# Patient Record
Sex: Female | Born: 1965
Health system: Southern US, Community
[De-identification: ages and names within clinical notes are randomized; demographics above are authoritative.]

## PROBLEM LIST (undated history)

## (undated) DIAGNOSIS — J45909 Unspecified asthma, uncomplicated: Secondary | ICD-10-CM

## (undated) HISTORY — DX: Unspecified asthma, uncomplicated: J45.909

## (undated) HISTORY — PX: KNEE ARTHROSCOPY: SUR90

---

## 1998-04-12 ENCOUNTER — Other Ambulatory Visit: Admission: RE | Admit: 1998-04-12 | Discharge: 1998-04-12 | Payer: Self-pay | Admitting: Obstetrics and Gynecology

## 1999-10-10 ENCOUNTER — Inpatient Hospital Stay (HOSPITAL_COMMUNITY): Admission: AD | Admit: 1999-10-10 | Discharge: 1999-10-10 | Payer: Self-pay | Admitting: Obstetrics & Gynecology

## 1999-10-29 ENCOUNTER — Encounter (INDEPENDENT_AMBULATORY_CARE_PROVIDER_SITE_OTHER): Payer: Self-pay | Admitting: Specialist

## 1999-10-29 ENCOUNTER — Inpatient Hospital Stay (HOSPITAL_COMMUNITY): Admission: AD | Admit: 1999-10-29 | Discharge: 1999-11-02 | Payer: Self-pay | Admitting: *Deleted

## 1999-12-11 ENCOUNTER — Encounter: Admission: RE | Admit: 1999-12-11 | Discharge: 2000-02-27 | Payer: Self-pay | Admitting: Obstetrics & Gynecology

## 2004-09-14 ENCOUNTER — Other Ambulatory Visit: Admission: RE | Admit: 2004-09-14 | Discharge: 2004-09-14 | Payer: Self-pay | Admitting: Obstetrics and Gynecology

## 2005-09-25 ENCOUNTER — Other Ambulatory Visit: Admission: RE | Admit: 2005-09-25 | Discharge: 2005-09-25 | Payer: Self-pay | Admitting: Obstetrics and Gynecology

## 2007-01-22 ENCOUNTER — Other Ambulatory Visit: Admission: RE | Admit: 2007-01-22 | Discharge: 2007-01-22 | Payer: Self-pay | Admitting: Obstetrics and Gynecology

## 2008-12-09 ENCOUNTER — Other Ambulatory Visit: Admission: RE | Admit: 2008-12-09 | Discharge: 2008-12-09 | Payer: Self-pay | Admitting: Obstetrics and Gynecology

## 2010-09-26 ENCOUNTER — Other Ambulatory Visit (HOSPITAL_COMMUNITY)
Admission: RE | Admit: 2010-09-26 | Discharge: 2010-09-26 | Disposition: A | Discharge status: Home / Self Care | Payer: BC Managed Care – PPO | Source: Ambulatory Visit | Attending: Obstetrics and Gynecology | Admitting: Obstetrics and Gynecology

## 2010-09-26 ENCOUNTER — Other Ambulatory Visit: Payer: Self-pay | Admitting: Nurse Practitioner

## 2010-09-26 DIAGNOSIS — Z1159 Encounter for screening for other viral diseases: Secondary | ICD-10-CM | POA: Insufficient documentation

## 2010-09-26 DIAGNOSIS — Z01419 Encounter for gynecological examination (general) (routine) without abnormal findings: Secondary | ICD-10-CM | POA: Insufficient documentation

## 2010-10-17 ENCOUNTER — Other Ambulatory Visit: Payer: Self-pay | Admitting: Nurse Practitioner

## 2010-10-18 ENCOUNTER — Other Ambulatory Visit: Payer: Self-pay | Admitting: Obstetrics and Gynecology

## 2010-12-15 NOTE — Op Note (Signed)
[facility]  Patient:    Dana Savage, Dana Savage                          MRN: [id number] Proc. Date: 10/30/99 Adm. Date:  [id number] Disc. Date: [id number] Attending:  Donne Hazel                           Operative Report  PROCEDURE:                    Primary low transverse cervical cesarean section ith delivery of viable female infant, Apgars of 9 at one minute and 9 at five minutes.  SECONDARY PROCEDURE:          Myomectomy of an approximately 3 cm myoma on the anterior fundal surface.  SURGEON:                      Freddy Finner, M.D.  ANESTHESIA:                   Epidural.  ESTIMATED BLOOD LOSS:         600 cc.  COMPLICATIONS:                None.  PREOPERATIVE DIAGNOSIS:       Failure to progress due to cephalopelvic disproportion.  PROCEDURE:                    Patient is a 45 year old who had a protracted active phase of labor by 6:30 a.m. on the second.  She was 8 cm, completely effaced with a vertex moving to a -1 station.  At approximately 9:30 which was three hours later there was essentially no change in her cervix and the head was molding, but possibly in the occiput posterior.  It was not felt that the patient could delivery under any circumstances vaginally.  It was elected to proceed with cesarean.                                Her epidural was in place and it was dosed for surgery.  She was brought to the operating room and placed in the dorsal recumbent position.  Lower abdomen was prepped and draped in the usual fashion.  Foley catheter was indwelling.  A lower abdominal transverse incision was made and carried sharply down to the fascia which was entered sharply and extended to the extent of the skin incision.  Rectus ______ was developed superiorly and inferiorly with blunt and sharp dissection.  Rectus muscles divided in the midline. Peritoneum was entered sharply and extended bluntly to the extent of the  skin incision.  Bladder blade was placed.  Transverse incision was made in the visceral peritoneum overlying the lower uterine segment with the bladder blunt dissected off the lower segment.  Transverse incision was made in the lower segment and extended bluntly in a transverse direction.  A viable female infant was then delivered with Apgars of 9 and 9.  Birth weight was 8 pounds 1 ounce.  Placenta and other products of conception removed from the uterus.  Uterus was inspected and found to have  large sessile fibroid on the fundus anteriorly and this was sharply excised and the defect was very superficial.  It was closed with a couple of figure-of-eight sutures of  0 Monocryl.  The uterine incision itself was closed with running locking 0 Monocryl for uterine closure.  Hemostasis was adequate.  Irrigation was carried out.  Bladder flap was reapproximated with running 0 Monocryl.  All packs, needles, and instruments were removed.  Hemostasis was complete.  Tubes and ovaries were  inspected and found to be normal.  The abdominal incision was then closed in layers running 0 Monocryl.  It was used to close the peritoneum and reapproximate the rectus muscles.  Fascia was closed with running 0 PDS.  Skin was closed with wide skin staples and 1/4 inch Steri-Strips.  Patient tolerated the procedure well and was taken to recovery in good condition. DD:  11/17/99 TD:  11/17/99 Job: [id number]

## 2010-12-15 NOTE — Discharge Summary (Signed)
[facility]  Patient:    Dana Savage, Dana Savage                          MRN: [id number] Adm. Date:  [id number] Disc. Date: [id number] Attending:  Donne Hazel Dictator:   Danie Chandler, R.N.                           Discharge Summary  ADMISSION DIAGNOSIS:          Intrauterine pregnancy at term with spontaneous rupture of membranes in early labor.  DISCHARGE DIAGNOSES:          1. Intrauterine pregnancy at term with                                  spontaneous rupture of membranes in early                                  labor.                               2. Failure to progress due to cephalopelvic                                  disproportion.  PROCEDURES:                   On October 30, 1999, primary low transverse cervical cesarean section and a secondary procedure of a myomectomy of approximately 3 cm myoma on the anterior fundal surface.  HISTORY OF PRESENT ILLNESS:   The patient is a 45 year old, gravida 1, para 0, at 37-1/[redacted] weeks gestation, who was admitted with spontaneous rupture of membranes in early labor.  The patient had a protracted active phase of labor by 6:30 a.m. on October 30, 1999.  She was 8 cm, completely effaced with a vertex moving to a -1 station.  At approximately 9:30 a.m., which was approximately three hours later, there was essentially no change in her cervix and the head was molding, but possibly in the occipitoposterior position.  It was not felt that the patient could deliver under any circumstances vaginally. It was elected to proceed with primary cesarean delivery.  HOSPITAL COURSE:              The patient was taken to the operating room and underwent the above-named procedure without complication.  This was productive of a viable female infant with Apgars of 9 and one minute and 9 at five minutes. Postoperatively on postoperative day #1, the patients hemoglobin was 9.6, hematocrit 29.0, and white blood cell count  13.6.  On postoperative day #2, the patient had a good return of bowel function and was tolerating a regular diet.  She was also ambulating well without difficulty and had good pain control.  Her hemoglobin was stable on this day at 9.3.  DISPOSITION:                  She was discharged home on postoperative day #3.  CONDITION ON DISCHARGE:       Good.  DIET:  Regular as tolerated.  ACTIVITY:                     No heavy lifting, no driving, and no vaginal entry.  FOLLOW-UP:                    She is to follow up in the office in one to two weeks for incision check.  She is to call for temperature greater than 100 degrees, persistent nausea, vomiting, heavy vaginal bleeding, and/or redness or drainage from the incision site.  DISCHARGE MEDICATIONS: 1. Prenatal vitamins one p.o. q.d. 2. Percocet as directed by M.D. DD:  12/20/99 TD:  12/23/99 Job: [id number]

## 2011-05-22 ENCOUNTER — Encounter (HOSPITAL_COMMUNITY): Payer: Self-pay

## 2011-05-22 ENCOUNTER — Encounter (HOSPITAL_COMMUNITY)
Admission: RE | Admit: 2011-05-22 | Discharge: 2011-05-22 | Disposition: A | Discharge status: Home / Self Care | Payer: BC Managed Care – PPO | Source: Ambulatory Visit | Attending: Obstetrics and Gynecology | Admitting: Obstetrics and Gynecology

## 2011-05-22 LAB — CBC
HCT: 35.4 % — ABNORMAL LOW (ref 36.0–46.0)
Hemoglobin: 11.3 g/dL — ABNORMAL LOW (ref 12.0–15.0)
MCH: 27.2 pg (ref 26.0–34.0)
MCHC: 31.9 g/dL (ref 30.0–36.0)
RDW: 13.2 % (ref 11.5–15.5)

## 2011-05-22 LAB — SURGICAL PCR SCREEN
MRSA, PCR: NEGATIVE
Staphylococcus aureus: NEGATIVE

## 2011-05-22 NOTE — Patient Instructions (Signed)
   Your procedure is scheduled on: MOnday 05/28/11  Enter through the Main Entrance of [facility] at:6am Pick up the phone at the desk and dial [phone] and inform us of your arrival.  Please call this number if you have any problems the morning of surgery: [phone]  Remember: Do not eat food after midnight:Sunday Do not drink clear liquids after :midnight Sunday Take these medicines the morning of surgery with a SIP OF WATER: none  Do not wear jewelry, make-up, or FINGER nail polish Do not wear lotions, powders, or perfumes.  You may wear deodorant. Do not shave 48 hours prior to surgery. Do not bring valuables to the hospital.  Leave suitcase in the car. After Surgery it may be brought to your room. For patients being admitted to the hospital, checkout time is 11:00am the day of discharge.  Patients discharged on the day of surgery will not be allowed to drive home.   Name and phone number of your driver: Lacy Duverney- cell- [phone]  Remember to use your hibiclens as instructed.Please shower with 1/2 bottle the evening before your surgery and the other 1/2 bottle the morning of surgery.

## 2011-05-23 ENCOUNTER — Other Ambulatory Visit: Payer: Self-pay | Admitting: Obstetrics and Gynecology

## 2011-05-23 ENCOUNTER — Other Ambulatory Visit (HOSPITAL_COMMUNITY)
Admission: RE | Admit: 2011-05-23 | Discharge: 2011-05-23 | Disposition: A | Discharge status: Home / Self Care | Payer: BC Managed Care – PPO | Source: Ambulatory Visit | Attending: Obstetrics and Gynecology | Admitting: Obstetrics and Gynecology

## 2011-05-23 DIAGNOSIS — Z01419 Encounter for gynecological examination (general) (routine) without abnormal findings: Secondary | ICD-10-CM | POA: Insufficient documentation

## 2011-05-25 ENCOUNTER — Other Ambulatory Visit: Payer: Self-pay | Admitting: Obstetrics and Gynecology

## 2011-05-27 MED ORDER — DEXTROSE 5 % IV SOLN
1.0000 g | INTRAVENOUS | Status: AC
  Administered 2011-05-28: 1 g via INTRAVENOUS
  Filled 2011-05-27: qty 1

## 2011-05-28 ENCOUNTER — Encounter (HOSPITAL_COMMUNITY): Payer: Self-pay | Admitting: *Deleted

## 2011-05-28 ENCOUNTER — Inpatient Hospital Stay (HOSPITAL_COMMUNITY): Payer: BC Managed Care – PPO | Admitting: Anesthesiology

## 2011-05-28 ENCOUNTER — Encounter (HOSPITAL_COMMUNITY): Payer: Self-pay | Admitting: Anesthesiology

## 2011-05-28 ENCOUNTER — Other Ambulatory Visit: Payer: Self-pay | Admitting: Obstetrics and Gynecology

## 2011-05-28 ENCOUNTER — Inpatient Hospital Stay (HOSPITAL_COMMUNITY)
Admission: RE | Admit: 2011-05-28 | Discharge: 2011-05-30 | DRG: 359 | Disposition: A | Discharge status: Home / Self Care | Payer: BC Managed Care – PPO | Source: Ambulatory Visit | Attending: Obstetrics and Gynecology | Admitting: Obstetrics and Gynecology

## 2011-05-28 ENCOUNTER — Encounter (HOSPITAL_COMMUNITY)
Admission: RE | Disposition: A | Discharge status: Home / Self Care | Payer: Self-pay | Source: Ambulatory Visit | Attending: Obstetrics and Gynecology

## 2011-05-28 DIAGNOSIS — N8000 Endometriosis of the uterus, unspecified: Secondary | ICD-10-CM | POA: Diagnosis present

## 2011-05-28 DIAGNOSIS — D250 Submucous leiomyoma of uterus: Secondary | ICD-10-CM | POA: Diagnosis present

## 2011-05-28 DIAGNOSIS — N83209 Unspecified ovarian cyst, unspecified side: Secondary | ICD-10-CM | POA: Diagnosis present

## 2011-05-28 DIAGNOSIS — D252 Subserosal leiomyoma of uterus: Secondary | ICD-10-CM | POA: Diagnosis present

## 2011-05-28 DIAGNOSIS — Z01812 Encounter for preprocedural laboratory examination: Secondary | ICD-10-CM

## 2011-05-28 DIAGNOSIS — N946 Dysmenorrhea, unspecified: Secondary | ICD-10-CM | POA: Diagnosis present

## 2011-05-28 DIAGNOSIS — D251 Intramural leiomyoma of uterus: Secondary | ICD-10-CM | POA: Diagnosis present

## 2011-05-28 DIAGNOSIS — Z01818 Encounter for other preprocedural examination: Secondary | ICD-10-CM

## 2011-05-28 DIAGNOSIS — N920 Excessive and frequent menstruation with regular cycle: Principal | ICD-10-CM | POA: Diagnosis present

## 2011-05-28 DIAGNOSIS — Z90710 Acquired absence of both cervix and uterus: Secondary | ICD-10-CM

## 2011-05-28 HISTORY — PX: ABDOMINAL HYSTERECTOMY: SHX81

## 2011-05-28 HISTORY — PX: SALPINGOOPHORECTOMY: SHX82

## 2011-05-28 LAB — TYPE AND SCREEN
ABO/RH(D): A POS
Antibody Screen: NEGATIVE

## 2011-05-28 SURGERY — HYSTERECTOMY, ABDOMINAL
Anesthesia: Choice | Site: Abdomen | Wound class: Clean Contaminated

## 2011-05-28 MED ORDER — MIDAZOLAM HCL 5 MG/5ML IJ SOLN
INTRAMUSCULAR | Status: DC | PRN
  Administered 2011-05-28: 1 mg via INTRAVENOUS

## 2011-05-28 MED ORDER — DIPHENHYDRAMINE HCL 12.5 MG/5ML PO ELIX: 12.5000 mg | ORAL_SOLUTION | Freq: Four times a day (QID) | ORAL | Status: DC | PRN

## 2011-05-28 MED ORDER — SENNOSIDES-DOCUSATE SODIUM 8.6-50 MG PO TABS
2.0000 | ORAL_TABLET | Freq: Every day | ORAL | Status: DC | PRN
  Administered 2011-05-29: 2 via ORAL

## 2011-05-28 MED ORDER — FENTANYL CITRATE 0.05 MG/ML IJ SOLN
INTRAMUSCULAR | Status: AC
  Administered 2011-05-28: 50 ug via INTRAVENOUS
  Filled 2011-05-28: qty 2

## 2011-05-28 MED ORDER — DEXAMETHASONE SODIUM PHOSPHATE 10 MG/ML IJ SOLN
INTRAMUSCULAR | Status: AC
  Filled 2011-05-28: qty 1

## 2011-05-28 MED ORDER — NEOSTIGMINE METHYLSULFATE 1 MG/ML IJ SOLN
INTRAMUSCULAR | Status: DC | PRN
  Administered 2011-05-28: 4 mg via INTRAVENOUS

## 2011-05-28 MED ORDER — GLYCOPYRROLATE 0.2 MG/ML IJ SOLN
INTRAMUSCULAR | Status: DC | PRN
  Administered 2011-05-28: .8 mg via INTRAVENOUS

## 2011-05-28 MED ORDER — ROCURONIUM BROMIDE 50 MG/5ML IV SOLN
INTRAVENOUS | Status: AC
  Filled 2011-05-28: qty 1

## 2011-05-28 MED ORDER — NEOSTIGMINE METHYLSULFATE 1 MG/ML IJ SOLN
INTRAMUSCULAR | Status: AC
  Filled 2011-05-28: qty 10

## 2011-05-28 MED ORDER — FENTANYL CITRATE 0.05 MG/ML IJ SOLN
25.0000 ug | INTRAMUSCULAR | Status: DC | PRN
  Administered 2011-05-28 (×2): 50 ug via INTRAVENOUS

## 2011-05-28 MED ORDER — DEXAMETHASONE SODIUM PHOSPHATE 10 MG/ML IJ SOLN
INTRAMUSCULAR | Status: DC | PRN
  Administered 2011-05-28: 10 mg via INTRAVENOUS

## 2011-05-28 MED ORDER — GLYCOPYRROLATE 0.2 MG/ML IJ SOLN
INTRAMUSCULAR | Status: AC
  Filled 2011-05-28: qty 2

## 2011-05-28 MED ORDER — PROPOFOL 10 MG/ML IV EMUL
INTRAVENOUS | Status: DC | PRN
  Administered 2011-05-28: 150 mg via INTRAVENOUS

## 2011-05-28 MED ORDER — FENTANYL CITRATE 0.05 MG/ML IJ SOLN
INTRAMUSCULAR | Status: DC | PRN
  Administered 2011-05-28: 100 ug via INTRAVENOUS
  Administered 2011-05-28 (×4): 50 ug via INTRAVENOUS

## 2011-05-28 MED ORDER — ONDANSETRON HCL 4 MG/2ML IJ SOLN
INTRAMUSCULAR | Status: DC | PRN
  Administered 2011-05-28: 4 mg via INTRAVENOUS

## 2011-05-28 MED ORDER — PROMETHAZINE HCL 25 MG/ML IJ SOLN
12.5000 mg | INTRAMUSCULAR | Status: DC | PRN
  Filled 2011-05-28: qty 1

## 2011-05-28 MED ORDER — EPHEDRINE 5 MG/ML INJ
INTRAVENOUS | Status: AC
  Filled 2011-05-28: qty 10

## 2011-05-28 MED ORDER — ONDANSETRON HCL 4 MG/2ML IJ SOLN
4.0000 mg | Freq: Four times a day (QID) | INTRAMUSCULAR | Status: DC | PRN
  Administered 2011-05-28: 4 mg via INTRAVENOUS
  Filled 2011-05-28 (×2): qty 2

## 2011-05-28 MED ORDER — FENTANYL CITRATE 0.05 MG/ML IJ SOLN
INTRAMUSCULAR | Status: AC
  Filled 2011-05-28: qty 2

## 2011-05-28 MED ORDER — HYDROMORPHONE 0.3 MG/ML IV SOLN
INTRAVENOUS | Status: AC
  Filled 2011-05-28: qty 25

## 2011-05-28 MED ORDER — HYDROMORPHONE 0.3 MG/ML IV SOLN
INTRAVENOUS | Status: DC
  Administered 2011-05-28: 6 mL via INTRAVENOUS
  Administered 2011-05-28: 1.2 mg via INTRAVENOUS
  Administered 2011-05-28: 12:00:00 via INTRAVENOUS
  Administered 2011-05-28: 1 mL via INTRAVENOUS
  Administered 2011-05-29: 0.3 mg via INTRAVENOUS
  Administered 2011-05-29: 0.6 mg via INTRAVENOUS

## 2011-05-28 MED ORDER — ONDANSETRON HCL 4 MG/2ML IJ SOLN
INTRAMUSCULAR | Status: AC
  Filled 2011-05-28: qty 2

## 2011-05-28 MED ORDER — ALBUTEROL SULFATE HFA 108 (90 BASE) MCG/ACT IN AERS
INHALATION_SPRAY | RESPIRATORY_TRACT | Status: AC
  Administered 2011-05-28: 2 via RESPIRATORY_TRACT
  Filled 2011-05-28: qty 6.7

## 2011-05-28 MED ORDER — MIDAZOLAM HCL 2 MG/2ML IJ SOLN
INTRAMUSCULAR | Status: AC
  Filled 2011-05-28: qty 2

## 2011-05-28 MED ORDER — KETOROLAC TROMETHAMINE 30 MG/ML IJ SOLN
30.0000 mg | Freq: Three times a day (TID) | INTRAMUSCULAR | Status: DC
  Administered 2011-05-28 – 2011-05-29 (×3): 30 mg via INTRAVENOUS
  Filled 2011-05-28 (×3): qty 1

## 2011-05-28 MED ORDER — KETOROLAC TROMETHAMINE 30 MG/ML IJ SOLN
INTRAMUSCULAR | Status: DC | PRN
  Administered 2011-05-28: 30 mg via INTRAVENOUS

## 2011-05-28 MED ORDER — DIPHENHYDRAMINE HCL 50 MG/ML IJ SOLN: 12.5000 mg | Freq: Four times a day (QID) | INTRAMUSCULAR | Status: DC | PRN

## 2011-05-28 MED ORDER — FENTANYL CITRATE 0.05 MG/ML IJ SOLN
INTRAMUSCULAR | Status: AC
  Filled 2011-05-28: qty 5

## 2011-05-28 MED ORDER — LACTATED RINGERS IV SOLN
INTRAVENOUS | Status: DC
  Administered 2011-05-28 (×4): via INTRAVENOUS

## 2011-05-28 MED ORDER — LIDOCAINE HCL (CARDIAC) 20 MG/ML IV SOLN
INTRAVENOUS | Status: DC | PRN
  Administered 2011-05-28: 80 mg via INTRAVENOUS

## 2011-05-28 MED ORDER — LACTATED RINGERS IV SOLN
INTRAVENOUS | Status: DC
  Administered 2011-05-28 – 2011-05-29 (×2): via INTRAVENOUS

## 2011-05-28 MED ORDER — EPHEDRINE SULFATE 50 MG/ML IJ SOLN
INTRAMUSCULAR | Status: DC | PRN
  Administered 2011-05-28: 5 mg via INTRAVENOUS

## 2011-05-28 MED ORDER — SUCCINYLCHOLINE CHLORIDE 20 MG/ML IJ SOLN
INTRAMUSCULAR | Status: AC
  Filled 2011-05-28: qty 1

## 2011-05-28 MED ORDER — PROPOFOL 10 MG/ML IV EMUL
INTRAVENOUS | Status: AC
  Filled 2011-05-28: qty 20

## 2011-05-28 MED ORDER — NALOXONE HCL 0.4 MG/ML IJ SOLN: 0.4000 mg | INTRAMUSCULAR | Status: DC | PRN

## 2011-05-28 MED ORDER — SUCCINYLCHOLINE CHLORIDE 20 MG/ML IJ SOLN
INTRAMUSCULAR | Status: DC | PRN
  Administered 2011-05-28: 80 mg via INTRAVENOUS

## 2011-05-28 MED ORDER — ROCURONIUM BROMIDE 100 MG/10ML IV SOLN
INTRAVENOUS | Status: DC | PRN
  Administered 2011-05-28: 5 mg via INTRAVENOUS
  Administered 2011-05-28: 25 mg via INTRAVENOUS
  Administered 2011-05-28: 10 mg via INTRAVENOUS

## 2011-05-28 MED ORDER — IBUPROFEN 600 MG PO TABS
600.0000 mg | ORAL_TABLET | Freq: Four times a day (QID) | ORAL | Status: DC | PRN
  Administered 2011-05-29 – 2011-05-30 (×3): 600 mg via ORAL
  Filled 2011-05-28 (×3): qty 1

## 2011-05-28 MED ORDER — MICROFIBRILLAR COLL HEMOSTAT EX POWD
CUTANEOUS | Status: DC | PRN
  Administered 2011-05-28: 1 g via TOPICAL

## 2011-05-28 MED ORDER — SODIUM CHLORIDE 0.9 % IJ SOLN: 9.0000 mL | INTRAMUSCULAR | Status: DC | PRN

## 2011-05-28 MED ORDER — LIDOCAINE HCL (CARDIAC) 20 MG/ML IV SOLN
INTRAVENOUS | Status: AC
  Filled 2011-05-28: qty 5

## 2011-05-28 SURGICAL SUPPLY — 44 items
APL SKNCLS STERI-STRIP NONHPOA (GAUZE/BANDAGES/DRESSINGS) ×2
APPLICATOR COTTON TIP 6IN STRL (MISCELLANEOUS) ×3 IMPLANT
BENZOIN TINCTURE PRP APPL 2/3 (GAUZE/BANDAGES/DRESSINGS) ×1 IMPLANT
CANISTER SUCTION 2500CC (MISCELLANEOUS) ×3 IMPLANT
CLOTH BEACON ORANGE TIMEOUT ST (SAFETY) ×3 IMPLANT
CONT PATH 16OZ SNAP LID 3702 (MISCELLANEOUS) ×3 IMPLANT
DECANTER SPIKE VIAL GLASS SM (MISCELLANEOUS) IMPLANT
DRAPE UTILITY XL STRL (DRAPES) ×3 IMPLANT
DRESSING TELFA 8X3 (GAUZE/BANDAGES/DRESSINGS) ×1 IMPLANT
DRSG PAD ABDOMINAL 8X10 ST (GAUZE/BANDAGES/DRESSINGS) ×1 IMPLANT
GAUZE SPONGE 4X4 12PLY STRL LF (GAUZE/BANDAGES/DRESSINGS) ×1 IMPLANT
GAUZE SPONGE 4X4 16PLY XRAY LF (GAUZE/BANDAGES/DRESSINGS) ×3 IMPLANT
GLOVE BIOGEL M 6.5 STRL (GLOVE) ×2 IMPLANT
GLOVE BIOGEL PI IND STRL 6.5 (GLOVE) ×4 IMPLANT
GLOVE BIOGEL PI INDICATOR 6.5 (GLOVE) ×2
GOWN PREVENTION PLUS LG XLONG (DISPOSABLE) ×6 IMPLANT
GOWN PREVENTION PLUS XLARGE (GOWN DISPOSABLE) ×3 IMPLANT
NDL HYPO 25X1 1.5 SAFETY (NEEDLE) IMPLANT
NEEDLE HYPO 25X1 1.5 SAFETY (NEEDLE) IMPLANT
NS IRRIG 1000ML POUR BTL (IV SOLUTION) ×3 IMPLANT
PACK ABDOMINAL GYN (CUSTOM PROCEDURE TRAY) ×3 IMPLANT
PAD OB MATERNITY 4.3X12.25 (PERSONAL CARE ITEMS) ×3 IMPLANT
SPONGE LAP 18X18 X RAY DECT (DISPOSABLE) ×6 IMPLANT
STAPLER VISISTAT 35W (STAPLE) IMPLANT
STRIP CLOSURE SKIN 1/2X4 (GAUZE/BANDAGES/DRESSINGS) ×1 IMPLANT
SUT PDS AB 0 CT1 27 (SUTURE) ×12 IMPLANT
SUT PDS AB 1 CTX 36 (SUTURE) IMPLANT
SUT VIC AB 0 CT1 18XCR BRD8 (SUTURE) IMPLANT
SUT VIC AB 0 CT1 27 (SUTURE)
SUT VIC AB 0 CT1 27XCR 8 STRN (SUTURE) IMPLANT
SUT VIC AB 0 CT1 36 (SUTURE) ×9 IMPLANT
SUT VIC AB 0 CT1 8-18 (SUTURE) ×9
SUT VIC AB 2-0 CT1 (SUTURE) ×2 IMPLANT
SUT VIC AB 2-0 CT1 27 (SUTURE) ×6
SUT VIC AB 2-0 CT1 TAPERPNT 27 (SUTURE) ×4 IMPLANT
SUT VIC AB 2-0 SH 27 (SUTURE) ×3
SUT VIC AB 2-0 SH 27XBRD (SUTURE) ×6 IMPLANT
SUT VIC AB 4-0 KS 27 (SUTURE) ×3 IMPLANT
SUT VICRYL 0 TIES 12 18 (SUTURE) ×3 IMPLANT
SYR CONTROL 10ML LL (SYRINGE) IMPLANT
TAPE CLOTH SURG 4X10 WHT LF (GAUZE/BANDAGES/DRESSINGS) ×1 IMPLANT
TOWEL OR 17X24 6PK STRL BLUE (TOWEL DISPOSABLE) ×7 IMPLANT
TRAY FOLEY CATH 14FR (SET/KITS/TRAYS/PACK) ×3 IMPLANT
WATER STERILE IRR 1000ML POUR (IV SOLUTION) ×2 IMPLANT

## 2011-05-28 NOTE — Anesthesia Procedure Notes (Signed)
Procedure Name: Intubation Date/Time: 05/28/2011 7:45 AM Performed by: Karleen Dolphin Pre-anesthesia Checklist: Patient identified, Patient being monitored, Timeout performed, Suction available and Emergency Drugs available Patient Re-evaluated:Patient Re-evaluated prior to inductionOxygen Delivery Method: Circle System Utilized Preoxygenation: Pre-oxygenation with 100% oxygen Intubation Type: IV induction Ventilation: Mask ventilation without difficulty Laryngoscope Size: Mac and 3 Grade View: Grade I Tube type: Oral Tube size: 7.0 mm Number of attempts: 1 Airway Equipment and Method: stylet Placement Confirmation: ETT inserted through vocal cords under direct vision,  breath sounds checked- equal and bilateral and positive ETCO2 Secured at: 20 cm Tube secured with: Tape Dental Injury: Teeth and Oropharynx as per pre-operative assessment

## 2011-05-28 NOTE — Transfer of Care (Signed)
Immediate Anesthesia Transfer of Care Note  Patient: Dana Savage  Procedure(s) Performed:  HYSTERECTOMY ABDOMINAL; SALPINGO OOPHERECTOMY  Patient Location: PACU  Anesthesia Type: General  Level of Consciousness: awake, alert  and oriented  Airway & Oxygen Therapy: Patient Spontanous Breathing and Patient connected to nasal cannula oxygen  Post-op Assessment: Report given to PACU RN and Post -op Vital signs reviewed and stable  Post vital signs: Reviewed and stable  Complications: No apparent anesthesia complications

## 2011-05-28 NOTE — Op Note (Signed)
Hysterectomy Procedure Note  Indications: Menorrhagia/ Dysmenorrhea   Pre-operative Diagnosis: 1 Menorrhagia 2 dysmenorrhea    Post-operative Diagnosis: same Plus endometriosis   Operation: Total abdominal hysterectomy, left salpingo-oophorectomy  Surgeon: Jessee Avers.   Assistants: Consuelo Pandy   Anesthesia: General endotracheal anesthesia  ASA Class: 2  Procedure Details  The patient was seen in the Holding Room. The risks, benefits, complications, treatment options, and expected outcomes were discussed with the patient.  The patient concurred with the proposed plan, giving informed consent.  The site of surgery properly noted/marked. The patient was taken to Operating Room # 3, identified as Dana Savage and the procedure verified as Total abdominal hysterectomy possible bilateral salpingoophorectomy . A Time Out was held and the above information confirmed.  After induction of anesthesia, the patient was draped and prepped in the usual sterile manner. Pt was placed in supine position after anesthesia and draped and prepped in the usual sterile manner. Foley catheter was placed.  A pfannensteil incision was made and carried through the subcutaneous tissue to the fascia. Fascial incision was made and extended laterally . The rectus muscles were separated. The peritoneum was identified and entered. Peritoneal incision was extended longitudinally.  The above findings were noted. Pt was found to have enlarged Left Ovary with complex cyst that appeared to be an endometrioma. The O connor Sullivan  retractor was placed and bowel was packed away from the surgical site.   The round ligaments were identified, cut, and ligated with 0-Vicryl. The anterior peritoneal reflection was incised and the bladder was dissected off the lower uterine segment. The retroperitoneal space was explored and the ureters were identified bilaterally. The right utero-ovarian ligament and proximal fallopian  tube were grasped, cut, and suture ligated with 0-Vicryl. The left infundibulo-pelvic ligament was grasped, cut and suture ligated with 0-Vicryl. Hemostasis  was observed. The uterine vessels were skeletonized, then clamped, cut and suture ligated with 0-Vicryl suture. Serial pedicles of the cardinal and utero-sacral ligaments were clamped, cut, and suture ligated with 0-Vicryl. Entrance was made into the vagina and the uterus removed. Vaginal cuff angle sutures were placed incorporating the utero-sacral ligaments for support. The vaginal cuff was then closed with a running stitch of 0- Vicryl. Lavage was carried out until clear. Hemostasis was observed. Avitene was placed into the Left retroperitoneal space.   Retractor and all packing was removed from the abdomen. The fascia was approximated with running sutures of 0-PDS.[name] Lavage was again carried out. Hemostasis was observed. The skin was approximated with staples.  Instrument, sponge, and needle counts were correct prior to abdominal closure and at the conclusion of the case.   Findings: Endometriosis. Enlarged left ovary with complex ovarian cyst .. Endometrial implants on the bladder serosa.... Normal appearly left ovary and fallopian tube   Estimated Blood Loss:  300 mL         Drains: foley catheter          Total IV Fluids: per anesthesia          Specimens: uterus/ cervix/ left fallopian tube and ovary          Implants: none         Complications:  None; patient tolerated the procedure well.         Disposition: PACU - hemodynamically stable.         Condition: stable  Attending Attestation: I performed the procedure.

## 2011-05-28 NOTE — H&P (Signed)
H&P update. History and physical exam are unchanged  

## 2011-05-28 NOTE — Anesthesia Preprocedure Evaluation (Addendum)
Anesthesia Evaluation  Patient identified by MRN, date of birth, ID band Patient awake  General Assessment Comment  Reviewed: Allergy & Precautions, H&P , NPO status , Patient's Chart, lab work & pertinent test results  Airway Mallampati: I TM Distance: >3 FB Neck ROM: full    Dental No notable dental hx.    Pulmonary asthma    Pulmonary exam normal       Cardiovascular     Neuro/Psych Negative Neurological ROS  Negative Psych ROS   GI/Hepatic negative GI ROS Neg liver ROS    Endo/Other  Negative Endocrine ROS  Renal/GU negative Renal ROS  Genitourinary negative   Musculoskeletal negative musculoskeletal ROS (+)   Abdominal Normal abdominal exam  (+)   Peds negative pediatric ROS (+)  Hematology negative hematology ROS (+)   Anesthesia Other Findings   Reproductive/Obstetrics (+) Pregnancy                          Anesthesia Physical Anesthesia Plan  ASA: II  Anesthesia Plan: Spinal and General   Post-op Pain Management:    Induction: Intravenous  Airway Management Planned: Oral ETT  Additional Equipment:   Intra-op Plan:   Post-operative Plan:   Informed Consent: I have reviewed the patients History and Physical, chart, labs and discussed the procedure including the risks, benefits and alternatives for the proposed anesthesia with the patient or authorized representative who has indicated his/her understanding and acceptance.     Plan Discussed with: CRNA  Anesthesia Plan Comments: (1. Pt used inhaler)        Anesthesia Quick Evaluation

## 2011-05-29 ENCOUNTER — Encounter (HOSPITAL_COMMUNITY): Payer: Self-pay | Admitting: Obstetrics and Gynecology

## 2011-05-29 LAB — CBC
Hemoglobin: 9.6 g/dL — ABNORMAL LOW (ref 12.0–15.0)
MCH: 27 pg (ref 26.0–34.0)
MCV: 84.3 fL (ref 78.0–100.0)
RBC: 3.56 MIL/uL — ABNORMAL LOW (ref 3.87–5.11)
WBC: 11.7 10*3/uL — ABNORMAL HIGH (ref 4.0–10.5)

## 2011-05-29 MED ORDER — SIMETHICONE 80 MG PO CHEW
80.0000 mg | CHEWABLE_TABLET | Freq: Four times a day (QID) | ORAL | Status: DC | PRN
  Administered 2011-05-29 (×2): 80 mg via ORAL

## 2011-05-29 MED ORDER — HYDROMORPHONE HCL 2 MG PO TABS
2.0000 mg | ORAL_TABLET | ORAL | Status: DC | PRN
  Administered 2011-05-29 – 2011-05-30 (×4): 2 mg via ORAL
  Filled 2011-05-29 (×4): qty 1

## 2011-05-29 NOTE — Addendum Note (Signed)
Addendum  created 05/29/11 4098 by Cephus Shelling   Modules edited:Notes Section

## 2011-05-29 NOTE — Anesthesia Postprocedure Evaluation (Signed)
  Anesthesia Post-op Note  Patient: Human resources officer  Procedure(s) Performed:  HYSTERECTOMY ABDOMINAL; SALPINGO OOPHERECTOMY  Patient Location: PACU  Anesthesia Type: General  Level of Consciousness: awake, alert  and oriented  Airway and Oxygen Therapy: Patient Spontanous Breathing  Post-op Pain: none  Post-op Assessment: Post-op Vital signs reviewed, Patient's Cardiovascular Status Stable, Respiratory Function Stable, Patent Airway, No signs of Nausea or vomiting and Pain level controlled  Post-op Vital Signs: Reviewed and stable  Complications: No apparent anesthesia complications

## 2011-05-29 NOTE — Progress Notes (Signed)
UR Chart review completed.  

## 2011-05-29 NOTE — Anesthesia Postprocedure Evaluation (Signed)
  Anesthesia Post-op Note  Patient: Human resources officer  Procedure(s) Performed:  HYSTERECTOMY ABDOMINAL; SALPINGO OOPHERECTOMY  Patient Location: PACU and Women's Unit  Anesthesia Type: General  Level of Consciousness: awake  Airway and Oxygen Therapy: Patient Spontanous Breathing  Post-op Pain: none  Post-op Assessment: Post-op Vital signs reviewed and Patient's Cardiovascular Status Stable  Post-op Vital Signs: Reviewed and stable  Complications: No apparent anesthesia complications

## 2011-05-29 NOTE — Progress Notes (Signed)
Subjective: Patient reports tolerating PO liquids only. She had nausea overnight no current nausea. Reports small episode of emesis last night. Pt states that emesis occurs when she uses her dilaudid pump. She denies flatus. Pain is well controlled .    Objective: I have reviewed patient's vital signs, intake and output and labs.  General: alert and cooperative GI: normal findings: bowel sounds normal Extremities: extremities normal, atraumatic, no cyanosis or edema   Assessment/Plan: POD #1 s/p TAH/ LSO doing well . Advance diet  D/c foley.. D/c pca start po pain medication   LOS: 1 day    Dana Savage J. 05/29/2011, 7:41 AM

## 2011-05-30 MED ORDER — BISACODYL 10 MG RE SUPP
10.0000 mg | Freq: Once | RECTAL | Status: AC
  Administered 2011-05-30: 10 mg via RECTAL
  Filled 2011-05-30: qty 1

## 2011-05-30 MED ORDER — HYDROMORPHONE HCL 2 MG PO TABS: 2.0000 mg | ORAL_TABLET | Freq: Four times a day (QID) | ORAL | Status: AC | PRN

## 2011-05-30 MED ORDER — IBUPROFEN 600 MG PO TABS: 600.0000 mg | ORAL_TABLET | Freq: Four times a day (QID) | ORAL | Status: AC | PRN

## 2011-05-30 NOTE — Progress Notes (Signed)
Subjective: Patient reports tolerating PO, + flatus and no problems voiding.    Objective: I have reviewed patient's vital signs, intake and output and labs.  General: alert and cooperative GI: normal findings: bowel sounds normal Extremities: extremities normal, atraumatic, no cyanosis or edema  Incision is clean dry and intact  Assessment/Plan: Doing well s/p TAH/ LSO D/C home   LOS: 2 days    Joan Herschberger J. 05/30/2011, 1:49 PM

## 2011-05-30 NOTE — Discharge Summary (Signed)
Physician Discharge Summary  Patient ID: Dana Savage MRN: [id number] DOB/AGE: 46/03/1966 46 y.o.  Admit date: 05/28/2011 Discharge date: 05/30/2011  Admission Diagnoses: Menorrhagia and Dysmenorrhea   Discharge Diagnoses: Same plus s/p TAH/ LSO Endometriosis  Active Problems:  * No active hospital problems. *    Discharged Condition: stable  Hospital Course: Pt was admitted on 05/28/2011 after undergoing TAH/LSO she did well postoperatively with retun of bowel and bladder function   Consults: none  Significant Diagnostic Studies: labs: HGB 9.6  Treatments: surgery: TAH/LSO   Discharge Exam: Blood pressure 125/71, pulse 76, temperature 98.5 F (36.9 C), temperature source Oral, resp. rate 18, height 5\' 2"  (1.575 m), weight 63.504 kg (140 lb), SpO2 98.00%. General appearance: alert and cooperative Female genitalia: normal, NA Extremities: extremities normal, atraumatic, no cyanosis or edema Incision/Wound:clean dry intact   Disposition:   Discharge Orders    Future Orders Please Complete By Expires   Diet - low sodium heart healthy      Increase activity slowly      Call MD for:  temperature >100.4      Call MD for:  persistant nausea and vomiting      Call MD for:  severe uncontrolled pain      Driving Restrictions      Comments:   No driving for 2 wks   Lifting restrictions      Comments:   Do not lift over 10 lbs   Sexual Activity Restrictions      Comments:   No sex for 6 wks     Current Discharge Medication List    START taking these medications   Details  HYDROmorphone (DILAUDID) 2 MG tablet Take 1 tablet (2 mg total) by mouth every 6 (six) hours as needed. Qty: 20 tablet, Refills: 0      CONTINUE these medications which have CHANGED   Details  ibuprofen (ADVIL,MOTRIN) 600 MG tablet Take 1 tablet (600 mg total) by mouth every 6 (six) hours as needed for pain (mild pain). Qty: 30 tablet, Refills: 1       Follow-up Information    Follow up with  Keyna Blizard J. in 2 weeks.   Contact information:   [address] [place] [address] [phone]          Signed: Jessee Avers. 05/30/2011, 1:56 PM

## 2011-09-06 ENCOUNTER — Other Ambulatory Visit: Payer: Self-pay | Admitting: Obstetrics and Gynecology

## 2011-09-06 DIAGNOSIS — Z1231 Encounter for screening mammogram for malignant neoplasm of breast: Secondary | ICD-10-CM

## 2011-09-21 ENCOUNTER — Ambulatory Visit: Payer: BC Managed Care – PPO

## 2012-05-26 ENCOUNTER — Other Ambulatory Visit: Payer: Self-pay | Admitting: Obstetrics and Gynecology

## 2012-05-26 DIAGNOSIS — Z1231 Encounter for screening mammogram for malignant neoplasm of breast: Secondary | ICD-10-CM

## 2012-06-27 ENCOUNTER — Emergency Department (INDEPENDENT_AMBULATORY_CARE_PROVIDER_SITE_OTHER)
Admission: EM | Admit: 2012-06-27 | Discharge: 2012-06-27 | Disposition: A | Discharge status: Home / Self Care | Payer: BC Managed Care – PPO | Source: Home / Self Care | Attending: Family Medicine | Admitting: Family Medicine

## 2012-06-27 ENCOUNTER — Ambulatory Visit
Admission: RE | Admit: 2012-06-27 | Discharge: 2012-06-27 | Disposition: A | Discharge status: Home / Self Care | Payer: BC Managed Care – PPO | Source: Ambulatory Visit | Attending: Obstetrics and Gynecology | Admitting: Obstetrics and Gynecology

## 2012-06-27 ENCOUNTER — Encounter (HOSPITAL_COMMUNITY): Payer: Self-pay | Admitting: *Deleted

## 2012-06-27 DIAGNOSIS — J019 Acute sinusitis, unspecified: Secondary | ICD-10-CM

## 2012-06-27 DIAGNOSIS — Z1231 Encounter for screening mammogram for malignant neoplasm of breast: Secondary | ICD-10-CM

## 2012-06-27 MED ORDER — IPRATROPIUM BROMIDE 0.06 % NA SOLN
2.0000 | Freq: Four times a day (QID) | NASAL | Status: DC
Start: 2012-06-27 — End: 2017-05-07

## 2012-06-27 MED ORDER — ALBUTEROL SULFATE HFA 108 (90 BASE) MCG/ACT IN AERS
2.0000 | INHALATION_SPRAY | Freq: Four times a day (QID) | RESPIRATORY_TRACT | Status: DC | PRN
Start: 2012-06-27 — End: 2017-05-07

## 2012-06-27 MED ORDER — AMOXICILLIN-POT CLAVULANATE 875-125 MG PO TABS: 1.0000 | ORAL_TABLET | Freq: Two times a day (BID) | ORAL | Status: DC

## 2012-06-27 NOTE — ED Provider Notes (Signed)
History     CSN: [id number]  Arrival date & time 06/27/12  4098   First MD Initiated Contact with Patient 06/27/12 [phone]      Chief Complaint  Patient presents with  . Cough    (Consider location/radiation/quality/duration/timing/severity/associated sxs/prior treatment) Patient is a 46 y.o. female presenting with cough. The history is provided by the patient.  Cough This is a new problem. The current episode started more than 1 week ago (2 weeks of sx). The problem has not changed since onset.The cough is productive of sputum. Associated symptoms include rhinorrhea and wheezing. She is not a smoker. Her past medical history is significant for asthma.    Past Medical History  Diagnosis Date  . Asthma     no asthma attack since age 59, but has occ mild wheezing- no inhaler currentlu    Past Surgical History  Procedure Date  . Cesarean section 2001  . Knee arthroscopy   . Abdominal hysterectomy 05/28/2011    Procedure: HYSTERECTOMY ABDOMINAL;  Surgeon: Jessee Avers;  Location: [facility];  Service: Gynecology;  Laterality: N/A;  . Salpingoophorectomy 05/28/2011    Procedure: SALPINGO OOPHERECTOMY;  Surgeon: Jessee Avers;  Location: [facility];  Service: Gynecology;  Laterality: Left;    No family history on file.  History  Substance Use Topics  . Smoking status: Never Smoker   . Smokeless tobacco: Not on file  . Alcohol Use: Yes     Comment: occasional wine    OB History    Grav Para Term Preterm Abortions TAB SAB Ect Mult Living                  Review of Systems  Constitutional: Negative.   HENT: Positive for congestion, rhinorrhea, postnasal drip and sinus pressure.   Respiratory: Positive for cough and wheezing.   Gastrointestinal: Negative.   Skin: Negative.     Allergies  Aspirin; Codeine; and Latex  Home Medications   Current Outpatient Rx  Name  Route  Sig  Dispense  Refill  . ALBUTEROL IN   Inhalation   Inhale into the lungs.         . ASPIRIN  PO   Oral   Take by mouth.         . GUAIFENESIN DM PO   Oral   Take by mouth.         . ALBUTEROL SULFATE HFA 108 (90 BASE) MCG/ACT IN AERS   Inhalation   Inhale 2 puffs into the lungs every 6 (six) hours as needed for wheezing.   1 Inhaler   0   . AMOXICILLIN-POT CLAVULANATE 875-125 MG PO TABS   Oral   Take 1 tablet by mouth 2 (two) times daily.   20 tablet   0   . IPRATROPIUM BROMIDE 0.06 % NA SOLN   Nasal   Place 2 sprays into the nose 4 (four) times daily.   15 mL   1     BP 120/79  Pulse 98  Temp 97.4 F (36.3 C) (Oral)  Resp 18  SpO2 98%  LMP 04/22/2011  Physical Exam  Nursing note and vitals reviewed. Constitutional: She is oriented to person, place, and time. She appears well-developed and well-nourished.  HENT:  Head: Normocephalic.  Right Ear: External ear normal.  Left Ear: External ear normal.  Nose: Mucosal edema and rhinorrhea present.  Mouth/Throat: Oropharynx is clear and moist.  Eyes: Conjunctivae normal are normal. Pupils are equal, round, and reactive to  light.  Neck: Normal range of motion. Neck supple.  Cardiovascular: Normal rate, regular rhythm, normal heart sounds and intact distal pulses.   Pulmonary/Chest: Effort normal and breath sounds normal.  Lymphadenopathy:    She has no cervical adenopathy.  Neurological: She is alert and oriented to person, place, and time.  Skin: Skin is warm and dry.    ED Course  Procedures (including critical care time)  Labs Reviewed - No data to display No results found.   1. Sinusitis, acute       MDM          Linna Hoff, MD 06/27/12 1007

## 2012-06-27 NOTE — ED Notes (Signed)
Pt  Reports  A  2  Week  History  Of  Cough  With  Sinus    Congestion   And  Nasal  stuffyness        Symptoms  Are  unreleived    By otc  meds       Pt  Reports  The  Cough is   For the  Most  Part  Non  Productive

## 2012-07-08 DIAGNOSIS — J309 Allergic rhinitis, unspecified: Secondary | ICD-10-CM | POA: Insufficient documentation

## 2012-07-08 DIAGNOSIS — J45909 Unspecified asthma, uncomplicated: Secondary | ICD-10-CM | POA: Insufficient documentation

## 2016-06-27 ENCOUNTER — Other Ambulatory Visit: Payer: Self-pay | Admitting: Nurse Practitioner

## 2016-06-27 DIAGNOSIS — Z1231 Encounter for screening mammogram for malignant neoplasm of breast: Secondary | ICD-10-CM

## 2016-06-29 ENCOUNTER — Ambulatory Visit
Admission: RE | Admit: 2016-06-29 | Discharge: 2016-06-29 | Disposition: A | Discharge status: Home / Self Care | Payer: 59 | Source: Ambulatory Visit | Attending: Nurse Practitioner | Admitting: Nurse Practitioner

## 2016-06-29 DIAGNOSIS — Z1231 Encounter for screening mammogram for malignant neoplasm of breast: Secondary | ICD-10-CM

## 2017-05-07 ENCOUNTER — Ambulatory Visit (INDEPENDENT_AMBULATORY_CARE_PROVIDER_SITE_OTHER): Payer: 59 | Admitting: Family Medicine

## 2017-05-07 ENCOUNTER — Encounter: Payer: Self-pay | Admitting: Family Medicine

## 2017-05-07 VITALS — BP 118/76 | HR 79 | Temp 97.9°F | Ht 62.0 in | Wt 153.6 lb

## 2017-05-07 DIAGNOSIS — E663 Overweight: Secondary | ICD-10-CM

## 2017-05-07 DIAGNOSIS — Z8249 Family history of ischemic heart disease and other diseases of the circulatory system: Secondary | ICD-10-CM | POA: Diagnosis not present

## 2017-05-07 DIAGNOSIS — M722 Plantar fascial fibromatosis: Secondary | ICD-10-CM | POA: Diagnosis not present

## 2017-05-07 DIAGNOSIS — Z0000 Encounter for general adult medical examination without abnormal findings: Secondary | ICD-10-CM | POA: Diagnosis not present

## 2017-05-07 MED ORDER — PREDNISONE 5 MG PO TABS: ORAL_TABLET | ORAL | 0 refills | Status: DC

## 2017-05-07 MED ORDER — CIPROFLOXACIN HCL 250 MG PO TABS: 250.0000 mg | ORAL_TABLET | Freq: Two times a day (BID) | ORAL | 0 refills | Status: DC

## 2017-05-07 MED ORDER — ONDANSETRON HCL 4 MG PO TABS: 4.0000 mg | ORAL_TABLET | Freq: Three times a day (TID) | ORAL | 0 refills | Status: DC | PRN

## 2017-05-07 MED ORDER — ALBUTEROL SULFATE HFA 108 (90 BASE) MCG/ACT IN AERS: 2.0000 | INHALATION_SPRAY | Freq: Four times a day (QID) | RESPIRATORY_TRACT | 0 refills | Status: DC | PRN

## 2017-05-07 NOTE — Patient Instructions (Signed)
[facility]:  [phone]

## 2017-05-07 NOTE — Progress Notes (Signed)
Dana Savage is a 51 y.o. female is here to [place].   Patient Care Team: Briscoe Deutscher, DO as PCP - General (Family Medicine)   History of Present Illness:   Dana Savage, CMA, acting as scribe for Dr. Juleen China.  HPI: Mother recently had MI. She would like a cardiovascular risk work-up. Nonsmoker. Loves to hike. Right plantar fasciitis. Has treid NSAIDs, ice, stretches, and proper shoes already.  Health Maintenance Due  Topic Date Due  . HIV Screening  05/08/1980  . TETANUS/TDAP  05/08/1984  . COLONOSCOPY  05/09/2015   Depression screen PHQ 2/9 05/07/2017  Decreased Interest 0  Down, Depressed, Hopeless 0  PHQ - 2 Score 0   PMHx, SurgHx, SocialHx, Medications, and Allergies were reviewed in the Visit Navigator and updated as appropriate.   Past Medical History:  Diagnosis Date  . Childhood asthma    Past Surgical History:  Procedure Laterality Date  . ABDOMINAL HYSTERECTOMY  05/28/2011   Procedure: HYSTERECTOMY ABDOMINAL;  Surgeon: Catha Brow;  Location: [facility];  Service: Gynecology;  Laterality: N/A;  . CESAREAN SECTION  2001  . KNEE ARTHROSCOPY    . SALPINGOOPHORECTOMY  05/28/2011   Procedure: SALPINGO OOPHERECTOMY;  Surgeon: Catha Brow;  Location: [facility];  Service: Gynecology;  Laterality: Left;   History reviewed. No pertinent family history. Social History  Substance Use Topics  . Smoking status: Never Smoker  . Smokeless tobacco: Never Used  . Alcohol use Yes     Comment: occasional wine   Current Medications and Allergies:   .  fluticasone (FLONASE) 50 MCG/ACT nasal spray, Place 1 spray into both nostrils at bedtime., Disp: , Rfl:  .  albuterol (PROVENTIL HFA;VENTOLIN HFA) 108 (90 Base) MCG/ACT inhaler, Inhale 2 puffs into the lungs every 6 (six) hours as needed for wheezing or shortness of breath., Disp: 1 Inhaler, Rfl: 0  Allergies  Allergen Reactions  . Aspirin Hives    Pt tolerates ibuprofen  . Codeine     Extreme vomiting  . Latex  Itching   Review of Systems:   Pertinent items are noted in the HPI. Otherwise, ROS is negative.  Vitals:   Vitals:   05/07/17 1107  BP: 118/76  Pulse: 79  Temp: 97.9 F (36.6 C)  TempSrc: Oral  SpO2: 97%  Weight: 153 lb 9.6 oz (69.7 kg)  Height: 5\' 2"  (1.575 m)     Body mass index is 28.09 kg/m.   Physical Exam:   Physical Exam  Constitutional: She is oriented to person, place, and time. She appears well-developed and well-nourished. No distress.  HENT:  Head: Normocephalic and atraumatic.  Right Ear: External ear normal.  Left Ear: External ear normal.  Nose: Nose normal.  Mouth/Throat: Oropharynx is clear and moist.  Eyes: Pupils are equal, round, and reactive to light. Conjunctivae and EOM are normal.  Neck: Normal range of motion. Neck supple. No thyromegaly present.  Cardiovascular: Normal rate, regular rhythm, normal heart sounds and intact distal pulses.   Pulmonary/Chest: Effort normal and breath sounds normal.  Abdominal: Soft. Bowel sounds are normal.  Musculoskeletal: Normal range of motion.  Left PF.  Lymphadenopathy:    She has no cervical adenopathy.  Neurological: She is alert and oriented to person, place, and time.  Skin: Skin is warm and dry. Capillary refill takes less than 2 seconds.  Psychiatric: She has a normal mood and affect. Her behavior is normal.  Nursing note and vitals reviewed.  Assessment and Plan:  Dana Savage was seen today for establish care.  Diagnoses and all orders for this visit:  Routine medical exam -     CBC with Differential/Platelet; Future -     Comprehensive metabolic panel; Future -     TSH; Future -     Lipid panel; Future  Family history of myocardial infarction -     EKG 12-Lead  Plantar fasciitis -     Ambulatory referral to Sports Medicine  Overweight (BMI 25.0-29.9) Comments: The patient is asked to make an attempt to improve diet and exercise patterns to aid in medical management of this problem.    . Reviewed expectations re: course of current medical issues. . Discussed self-management of symptoms. . Outlined signs and symptoms indicating need for more acute intervention. . Patient verbalized understanding and all questions were answered. [name] Health Maintenance issues including appropriate healthy diet, exercise, and smoking avoidance were discussed with patient. . See orders for this visit as documented in the electronic medical record. . Patient received an After Visit Summary.  CMA served as Education administrator during this visit. History, Physical, and Plan performed by medical provider. The above documentation has been reviewed and is accurate and complete. Briscoe Deutscher, D.O.  Patient Counseling: [x]    Nutrition: Stressed importance of moderation in sodium/caffeine intake, saturated fat and cholesterol, caloric balance, sufficient intake of fresh fruits, vegetables, fiber, calcium, iron, and 1 mg of folate supplement per day (for females capable of pregnancy).  [x]    Stressed the importance of regular exercise.   []    Substance Abuse: Discussed cessation/primary prevention of tobacco, alcohol, or other drug use; driving or other dangerous activities under the influence; availability of treatment for abuse.   []    Injury prevention: Discussed safety belts, safety helmets, smoke detector, smoking near bedding or upholstery.   []    Sexuality: Discussed sexually transmitted diseases, partner selection, use of condoms, avoidance of unintended pregnancy  and contraceptive alternatives.  [x]    Dental health: Discussed importance of regular tooth brushing, flossing, and dental visits.  [x]    Health maintenance and immunizations reviewed. Please refer to Health maintenance section.   Briscoe Deutscher, DO [place], [place] 05/07/2017  No future appointments.

## 2017-05-14 ENCOUNTER — Ambulatory Visit: Payer: Self-pay

## 2017-05-14 ENCOUNTER — Ambulatory Visit (INDEPENDENT_AMBULATORY_CARE_PROVIDER_SITE_OTHER): Payer: 59 | Admitting: Sports Medicine

## 2017-05-14 ENCOUNTER — Encounter: Payer: Self-pay | Admitting: Sports Medicine

## 2017-05-14 VITALS — BP 110/78 | HR 87 | Ht 62.0 in | Wt 153.2 lb

## 2017-05-14 DIAGNOSIS — M722 Plantar fascial fibromatosis: Secondary | ICD-10-CM

## 2017-05-14 DIAGNOSIS — M79672 Pain in left foot: Secondary | ICD-10-CM

## 2017-05-14 DIAGNOSIS — M79671 Pain in right foot: Secondary | ICD-10-CM | POA: Diagnosis not present

## 2017-05-14 NOTE — Patient Instructions (Addendum)
Please perform the exercise program that we have prepared for you and gone over in detail on a daily basis.  In addition to the handout you were provided you can access your program through: [email or web address]   Your unique program code is: [email or web address]  ++++++++++++++++++++++++++++++++++++++++++++  Look into getting a CEP plantar fascia sleeve, these are available online or through [employer] on [address]   ++++++++++++++++++++++++++++++++++++++++++++  Look into having [employer] cover a set of custom orthotics.  The code is L3030 and there are 2 units.  You can call them  and ask if this is covered.  I am happy to do these for you at any time, you just need to let our front office schedulers know you would like an "orthotic appointment."

## 2017-05-14 NOTE — Progress Notes (Signed)
OFFICE VISIT NOTE Dana Savage. Dana Savage, [place] at [facility] [place] - 51 y.o. female MRN [id number]  Date of birth: 1966-01-27  Visit Date: 05/14/2017  PCP: Briscoe Deutscher, DO   Referred by: Briscoe Deutscher, DO  Burlene Arnt, CMA acting as scribe for Dr. Paulla Fore.  SUBJECTIVE:   Chief Complaint  Patient presents with  . New Patient (Initial Visit)    foot pain   HPI: As below and per problem based documentation when appropriate.  Dana Savage is a new patient presenting today for evaluation of bilateral foot pain, L>R. Pain is mostly on the medial aspect of the foot and towards the heel. She has a constant feeling of fullness in the foot.  Pain has been present x 1 year and has been getting progressively getting worse. She is a hiker and pain is preventing her from hiking.  No known injury or trauma.  The pain is described as stabbing and aching and is rated as 5/10.  Pain seems to be worse when first getting up from lying or sitting for prolonged period of time. She has some pain while hiking and is worse when there is an uneven terrain and after hiking.  Pain seems to ease off after being up and moving around.  Therapies tried include : She has tried taking Ibuprofen with minimal relief. She has tried wearing a heel cup with short term relief. She has not tried soaking feet in hot or cold water.   Other associated symptoms include: At times pain will radiate toward the achilles, there is a lot of tightness in that area. She denies erythema, increased warmth, visible swelling.     Review of Systems  Constitutional: Negative for chills and fever.  Respiratory: Negative for shortness of breath and wheezing.   Cardiovascular: Negative for chest pain and palpitations.  Neurological: Negative for dizziness, tingling and headaches.    Otherwise per HPI.  HISTORY & PERTINENT PRIOR DATA:  No specialty comments available.  She reports that she has never smoked. She has never used smokeless tobacco. No results for input(s): HGBA1C, LABURIC in the last 8760 hours. Allergies reviewed per EMR Prior to Admission medications   Medication Sig Start Date End Date Taking? Authorizing Provider  albuterol (PROVENTIL HFA;VENTOLIN HFA) 108 (90 Base) MCG/ACT inhaler Inhale 2 puffs into the lungs every 6 (six) hours as needed for wheezing or shortness of breath. 05/07/17  Yes Briscoe Deutscher, DO  fluticasone (FLONASE) 50 MCG/ACT nasal spray Place 1 spray into both nostrils at bedtime.   Yes [provider]   Patient Active Problem List   Diagnosis Date Noted  . Family history of myocardial infarction 05/07/2017  . Plantar fasciitis 05/07/2017  . Overweight (BMI 25.0-29.9) 05/07/2017  . Allergic rhinitis 07/08/2012  . Mild asthma 07/08/2012   Past Medical History:  Diagnosis Date  . Childhood asthma    No family history on file. Past Surgical History:  Procedure Laterality Date  . ABDOMINAL HYSTERECTOMY  05/28/2011   Procedure: HYSTERECTOMY ABDOMINAL;  Surgeon: Catha Brow;  Location: [facility];  Service: Gynecology;  Laterality: N/A;  . CESAREAN SECTION  2001  . KNEE ARTHROSCOPY    . SALPINGOOPHORECTOMY  05/28/2011   Procedure: SALPINGO OOPHERECTOMY;  Surgeon: Catha Brow;  Location: [facility];  Service: Gynecology;  Laterality: Left;   Social History   Occupational History  . Not on file.   Social History Main Topics  .  Smoking status: Never Smoker  . Smokeless tobacco: Never Used  . Alcohol use Yes     Comment: occasional wine  . Drug use: No  . Sexual activity: Not on file    OBJECTIVE:  VS:  HT:5\' 2"  (157.5 cm)   WT:153 lb 3.2 oz (69.5 kg)  BMI:28.01    BP:110/78  HR:87bpm  TEMP: ( )  RESP:97 % EXAM: Findings:  WDWN, NAD, Non-toxic appearing Alert & appropriately interactive Not depressed or anxious appearing No increased work of breathing. Pupils are equal. EOM intact without  nystagmus No clubbing or cyanosis of the extremities appreciated No significant rashes/lesions/ulcerations overlying the examined area. DP & PT pulses 2+/4.  No significant pretibial edema. Sensation intact to light touch in lower extremities.  Bilateral feet: Overall well aligned.  She has marked  Lehigh arch that is rigid bilaterally right worse than left.  Her bilateral plantar fascia were markedly painful left worse than right.  She has a equinus contracture to 85 degrees on the right and 90 degrees on the left.  Ankle drawer testing is normal.    RADIOLOGY:  ASSESSMENT & PLAN:     ICD-10-CM   1. Bilateral foot pain M79.671 [place] LIMITED JOINT SPACE STRUCTURES LOW BILAT(NO LINKED CHARGES)   M79.672   2. Plantar fasciitis M72.2 Misc procedure   ================================================================= Plantar fasciitis Marked thickening of bilateral plantar fascial origins as well as longitudinal arch.  Repeated exercises, arch strapping/compression, custom cushioned insoles recommended.  Follow-up for FASTEC insoles at her convenience.  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  LIMITED MSK ULTRASOUND OF Bilateral Heels Images were obtained and interpreted by myself, Teresa Coombs, DO  Images have been saved and stored to PACS system. Images obtained on: GE S7 Ultrasound machine  FINDINGS:   Bilateral plantar fascia are markedly thick with thickening of the longitudinal arch as well.  Left plantar fascia is measuring 0.71 cm in diameter, the right is measuring 0.59 cm.  4 cm from the origin of the long arch is measuring greater than 0.2 cm  IMPRESSION:  1. Bilateral plantar fasciitis with bilateral long arch strain   PROCEDURE NOTE: THERAPEUTIC EXERCISES (97110) 15 minutes spent for Therapeutic exercises as below and as referenced in the AVS. This  included exercises focusing on stretching, strengthening, with significant focus on eccentric aspects.  Proper technique shown and discussed handout in great detail with ATC. All questions were discussed and answered.   Long term goals include an improvement in range of motion, strength, endurance as well as avoiding reinjury. Frequency of visits is one time as determined during today's  office visit. Frequency of exercises to be performed is as per handout.  EXERCISES REVIEWED:  Alfredson's  Achilles stretching  ================================================================= Patient Instructions  Please perform the exercise program that we have prepared for you and gone over in detail on a daily basis.  In addition to the handout you were provided you can access your program through: [email or web address]   Your unique program code is: [email or web address]  ++++++++++++++++++++++++++++++++++++++++++++  Look into getting a CEP plantar fascia sleeve, these are available online or through [employer] on [address]   ++++++++++++++++++++++++++++++++++++++++++++  Look into having [employer] cover a set of custom orthotics.  The code is L3030 and there are 2 units.  You can call them  and ask  if this is covered.  I am happy to do these for you at any time, you just need to let our front office schedulers know you would like an "orthotic appointment."   =================================================================  Follow-up: Return for orthotics.   CMA/ATC served as Education administrator during this visit. History, Physical, and Plan performed by medical provider. Documentation and orders reviewed and attested to.      Teresa Coombs, [place] [facility] Physician

## 2017-05-14 NOTE — Procedures (Signed)

## 2017-05-14 NOTE — Assessment & Plan Note (Signed)
Marked thickening of bilateral plantar fascial origins as well as longitudinal arch.  Repeated exercises, arch strapping/compression, custom cushioned insoles recommended.  Follow-up for FASTEC insoles at her convenience.  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

## 2017-05-14 NOTE — Procedures (Signed)
LIMITED MSK ULTRASOUND OF Bilateral Heels Images were obtained and interpreted by myself, Teresa Coombs, DO  Images have been saved and stored to PACS system. Images obtained on: GE S7 Ultrasound machine  FINDINGS:   Bilateral plantar fascia are markedly thick with thickening of the longitudinal arch as well.  Left plantar fascia is measuring 0.71 cm in diameter, the right is measuring 0.59 cm.  4 cm from the origin of the long arch is measuring greater than 0.2 cm  IMPRESSION:  1. Bilateral plantar fasciitis with bilateral long arch strain

## 2017-05-15 ENCOUNTER — Encounter: Payer: Self-pay | Admitting: Sports Medicine

## 2017-05-15 ENCOUNTER — Ambulatory Visit (INDEPENDENT_AMBULATORY_CARE_PROVIDER_SITE_OTHER): Payer: 59 | Admitting: Sports Medicine

## 2017-05-15 VITALS — BP 120/80 | HR 89 | Ht 62.0 in | Wt 153.4 lb

## 2017-05-15 DIAGNOSIS — M79672 Pain in left foot: Secondary | ICD-10-CM | POA: Diagnosis not present

## 2017-05-15 DIAGNOSIS — M79671 Pain in right foot: Secondary | ICD-10-CM | POA: Diagnosis not present

## 2017-05-15 DIAGNOSIS — M722 Plantar fascial fibromatosis: Secondary | ICD-10-CM

## 2017-05-15 NOTE — Progress Notes (Signed)
OFFICE VISIT NOTE Dana Savage. [place], [place] at [facility] - 51 y.o. female MRN [id number]  Date of birth: 1966/05/28  Visit Date: 05/15/2017  PCP: Briscoe Deutscher, DO   Referred by: Briscoe Deutscher, DO  Wendy Poet, ATC, PT scribing for Dr. Paulla Fore.  SUBJECTIVE:   Chief Complaint  Patient presents with  . Follow-up    bilateral foot pain   HPI: As below and per problem based documentation when appropriate.  Dana Savage is an established patient presenting today in follow-up of bilateral foot pain. She was last seen 05/14/17 and custom orthotics were recommended. She would like to be fitted for orthotics today.     Review of Systems  Constitutional: Negative.   Respiratory: Negative.   Cardiovascular: Negative.   Neurological: Negative.     Otherwise per HPI.  HISTORY & PERTINENT PRIOR DATA:  No specialty comments available. She reports that she has never smoked. She has never used smokeless tobacco. No results for input(s): HGBA1C, LABURIC in the last 8760 hours. Allergies reviewed per EMR Prior to Admission medications   Medication Sig Start Date End Date Taking? Authorizing Provider  albuterol (PROVENTIL HFA;VENTOLIN HFA) 108 (90 Base) MCG/ACT inhaler Inhale 2 puffs into the lungs every 6 (six) hours as needed for wheezing or shortness of breath. 05/07/17  Yes Briscoe Deutscher, DO  fluticasone (FLONASE) 50 MCG/ACT nasal spray Place 1 spray into both nostrils at bedtime.   Yes [provider]   Patient Active Problem List   Diagnosis Date Noted  . Family history of myocardial infarction 05/07/2017  . Plantar fasciitis 05/07/2017  . Overweight (BMI 25.0-29.9) 05/07/2017  . Allergic rhinitis 07/08/2012  . Mild asthma 07/08/2012   Past Medical History:  Diagnosis Date  . Childhood asthma    No family history on file. Past Surgical History:  Procedure Laterality Date  . ABDOMINAL  HYSTERECTOMY  05/28/2011   Procedure: HYSTERECTOMY ABDOMINAL;  Surgeon: Catha Brow;  Location: [facility];  Service: Gynecology;  Laterality: N/A;  . CESAREAN SECTION  2001  . KNEE ARTHROSCOPY    . SALPINGOOPHORECTOMY  05/28/2011   Procedure: SALPINGO OOPHERECTOMY;  Surgeon: Catha Brow;  Location: [facility];  Service: Gynecology;  Laterality: Left;   Social History   Occupational History  . Not on file.   Social History Main Topics  . Smoking status: Never Smoker  . Smokeless tobacco: Never Used  . Alcohol use Yes     Comment: occasional wine  . Drug use: No  . Sexual activity: Not on file    OBJECTIVE:  VS:  HT:5\' 2"  (157.5 cm)   WT:153 lb 6.4 oz (69.6 kg)  BMI:28.05    BP:120/80  HR:89bpm  TEMP: ( )  RESP:99 % EXAM: Findings:  DP & PT pulses 2+/4.  No significant pretibial edema. Sensation intact to light touch in lower extremities.  Bilateral feet: Overall well aligned.  She has marked  Lehigh arch that is rigid bilaterally right worse than left.  Her bilateral plantar fascia were markedly painful left worse than right.  She has a equinus contracture to 85 degrees on the right and 90 degrees on the left.  Ankle drawer testing is normal.  First and second splay toe bilaterally, left worse than right    RADIOLOGY: [place] LIMITED JOINT SPACE STRUCTURES LOW BILAT(NO LINKED CHARGES) Gerda Diss, DO     05/14/2017  8:51 AM LIMITED MSK ULTRASOUND OF  Bilateral Heels Images were obtained and interpreted by myself, Teresa Coombs, DO   Images have been saved and stored to PACS system. Images obtained on: GE S7 Ultrasound machine  FINDINGS:   Bilateral plantar fascia are markedly thick with thickening of  the longitudinal arch as well.  Left plantar fascia is measuring  0.71 cm in diameter, the right is measuring 0.59 cm.  4 cm from  the origin of the long arch is measuring greater than 0.2 cm  IMPRESSION:  1. Bilateral plantar fasciitis with bilateral long arch  strain  ASSESSMENT & PLAN:     ICD-10-CM   1. Bilateral foot pain M79.671 Misc procedure   M79.672   2. Plantar fasciitis M72.2 Misc procedure   ================================================================= PROCEDURE: CUSTOM ORTHOTIC FABRICATION Patient's underlying musculoskeletal conditions are directly related to poor biomechanics and will benefit from a functional custom orthotic.  There are no significant foot deformities that complicate the use of a custom orthotic.  The patient was fitted for a standard, cushioned, semi-rigid orthotic. The orthotic was heated & placed on the orthotic stand. The patient was positioned in subtalar neutral position and 10 of ankle dorsiflexion and weight bearing stance on the heated orthotic blank. After completion of the molding a base was applied to the orthotic blank. The orthotic was ground to a stable position for weightbearing. The patient ambulated in these and reported they were comfortable without pressure spots.              BLANK:  Size 7 - Standard Cushioned                 BASE:  Blue EVA      POSTINGS:  n/a  =================================================================   Follow-up: Return in about 4 weeks (around 06/12/2017).   CMA/ATC served as Education administrator during this visit. History, Physical, and Plan performed by medical provider. Documentation and orders reviewed and attested to.      Teresa Coombs, [place] Sports Medicine Physician

## 2017-05-17 NOTE — Procedures (Signed)
PROCEDURE: CUSTOM ORTHOTIC FABRICATION Patient's underlying musculoskeletal conditions are directly related to poor biomechanics and will benefit from a functional custom orthotic.  There are no significant foot deformities that complicate the use of a custom orthotic.  The patient was fitted for a standard, cushioned, semi-rigid orthotic. The orthotic was heated & placed on the orthotic stand. The patient was positioned in subtalar neutral position and 10 of ankle dorsiflexion and weight bearing stance on the heated orthotic blank. After completion of the molding a base was applied to the orthotic blank. The orthotic was ground to a stable position for weightbearing. The patient ambulated in these and reported they were comfortable without pressure spots.              BLANK:  Size 7 - Standard Cushioned                 BASE:  Blue EVA      POSTINGS:  n/a

## 2017-06-26 ENCOUNTER — Ambulatory Visit: Payer: 59 | Admitting: Sports Medicine

## 2017-07-01 ENCOUNTER — Ambulatory Visit: Payer: 59 | Admitting: Sports Medicine

## 2018-02-21 DIAGNOSIS — G5602 Carpal tunnel syndrome, left upper limb: Secondary | ICD-10-CM | POA: Insufficient documentation

## 2018-06-11 ENCOUNTER — Ambulatory Visit: Payer: Self-pay | Admitting: *Deleted

## 2018-06-11 NOTE — Telephone Encounter (Signed)
Pt calling stating that she has been feeling lightheaded and having tremors off and on for the past month. Pt states that she does not feel like she is going to pass out when she feels lightheaded and states she feels better with moving around. Pt states she is not on any medications currently. Pt states that she experienced the tremors previously but has noticed in the past month they have become more pronounced  and states some days she can feel it going up to the left side of her neck and face.Pt states that the tremors are in her whole body but it is really noticeable in her hands. Pt also voices that she has been becoming more exhausted for the past month and has experienced increased indigestion. Pt voicing concern that her uncle was diagnosed with Parkinson's recently and that he experienced the same severity of symptoms she is having currently. Pt denies having chest pain, shortness of breath, nausea or other symptoms at this time. Pt states she also has been diagnosed with carpal tunnel on the left and right side, with the right side being worse than the left. Pt states she is going to have surgery on 11/25 and wanted to be evaluated for these symptoms before having surgery. No appt availability with PCP for the remainder of the week. Pt scheduled with Dr. Rogers Blocker on 06/12/18. Pt advised to seek treatment in the ED if symptoms become worse before scheduled appt. Pt verbalized understaning.   Reason for Disposition . [1] MILD dizziness (e.g., walking normally) AND [2] has NOT been evaluated by physician for this  (Exception: dizziness caused by heat exposure, sudden standing, or poor fluid intake)  Answer Assessment - Initial Assessment Questions 1. DESCRIPTION: "Describe your dizziness."     Feels lightheaded and comes and goes 2. LIGHTHEADED: "Do you feel lightheaded?" (e.g., somewhat faint, woozy, weak upon standing)     yes 3. VERTIGO: "Do you feel like either you or the room is spinning or  tilting?" (i.e. vertigo)     no 4. SEVERITY: "How bad is it?"  "Do you feel like you are going to faint?" "Can you stand and walk?"   - MILD - walking normally   - MODERATE - interferes with normal activities (e.g., work, school)    - SEVERE - unable to stand, requires support to walk, feels like passing out now.      mild 5. ONSET:  "When did the dizziness begin?"     A month a go, comes and goes 6. AGGRAVATING FACTORS: "Does anything make it worse?" (e.g., standing, change in head position)     No, pt states feels better with moving around 7. HEART RATE: "Can you tell me your heart rate?" "How many beats in 15 seconds?"  (Note: not all patients can do this)       Not assessed 8. CAUSE: "What do you think is causing the dizziness?"     Feels better with standing  9. RECURRENT SYMPTOM: "Have you had dizziness before?" If so, ask: "When was the last time?" "What happened that time?"     Yes a month 10. OTHER SYMPTOMS: "Do you have any other symptoms?" (e.g., fever, chest pain, vomiting, diarrhea, bleeding)       No 11. PREGNANCY: "Is there any chance you are pregnant?" "When was your last menstrual period?"       No had a hysterectomy  Protocols used: DIZZINESS [facility]

## 2018-06-12 ENCOUNTER — Ambulatory Visit: Payer: 59 | Admitting: Family Medicine

## 2018-06-12 ENCOUNTER — Encounter: Payer: Self-pay | Admitting: Family Medicine

## 2018-06-12 VITALS — BP 116/88 | HR 80 | Temp 97.6°F | Ht 62.0 in | Wt 160.8 lb

## 2018-06-12 DIAGNOSIS — R251 Tremor, unspecified: Secondary | ICD-10-CM | POA: Diagnosis not present

## 2018-06-12 DIAGNOSIS — R2990 Unspecified symptoms and signs involving the nervous system: Secondary | ICD-10-CM

## 2018-06-12 LAB — COMPREHENSIVE METABOLIC PANEL
ALT: 31 U/L (ref 0–35)
AST: 25 U/L (ref 0–37)
Albumin: 4.3 g/dL (ref 3.5–5.2)
Alkaline Phosphatase: 74 U/L (ref 39–117)
BUN: 14 mg/dL (ref 6–23)
CHLORIDE: 104 meq/L (ref 96–112)
CO2: 28 meq/L (ref 19–32)
Calcium: 9.4 mg/dL (ref 8.4–10.5)
Creatinine, Ser: 0.65 mg/dL (ref 0.40–1.20)
GFR: 101.3 mL/min (ref >60.00)
Glucose, Bld: 119 mg/dL — ABNORMAL HIGH (ref 70–99)
Potassium: 4 mEq/L (ref 3.5–5.1)
Sodium: 140 mEq/L (ref 135–145)
Total Bilirubin: 0.3 mg/dL (ref 0.2–1.2)
Total Protein: 7.3 g/dL (ref 6.0–8.3)

## 2018-06-12 LAB — CBC WITH DIFFERENTIAL/PLATELET
BASOS PCT: 0.4 % (ref 0.0–3.0)
Basophils Absolute: 0 10*3/uL (ref 0.0–0.1)
EOS ABS: 0.1 10*3/uL (ref 0.0–0.7)
Eosinophils Relative: 1.7 % (ref 0.0–5.0)
HEMATOCRIT: 38.4 % (ref 36.0–46.0)
Hemoglobin: 12.5 g/dL (ref 12.0–15.0)
LYMPHS ABS: 2 10*3/uL (ref 0.7–4.0)
LYMPHS PCT: 29.6 % (ref 12.0–46.0)
MCHC: 32.5 g/dL (ref 30.0–36.0)
MCV: 82.2 fl (ref 78.0–100.0)
Monocytes Absolute: 0.4 10*3/uL (ref 0.1–1.0)
Monocytes Relative: 5.8 % (ref 3.0–12.0)
NEUTROS ABS: 4.3 10*3/uL (ref 1.4–7.7)
Neutrophils Relative %: 62.5 % (ref 43.0–77.0)
PLATELETS: 318 10*3/uL (ref 150.0–400.0)
RBC: 4.67 Mil/uL (ref 3.87–5.11)
RDW: 13.8 % (ref 11.5–15.5)
WBC: 6.8 10*3/uL (ref 4.0–10.5)

## 2018-06-12 LAB — C-REACTIVE PROTEIN: CRP: 0.9 mg/dL (ref 0.5–20.0)

## 2018-06-12 LAB — VITAMIN D 25 HYDROXY (VIT D DEFICIENCY, FRACTURES): VITD: 26.24 ng/mL — AB (ref 30.00–100.00)

## 2018-06-12 LAB — SEDIMENTATION RATE: Sed Rate: 61 mm/hr — ABNORMAL HIGH (ref 0–30)

## 2018-06-12 LAB — VITAMIN B12: Vitamin B-12: 571 pg/mL (ref 211–911)

## 2018-06-12 LAB — TSH: TSH: 1.56 u[IU]/mL (ref 0.35–4.50)

## 2018-06-12 NOTE — Progress Notes (Signed)
Patient: Dana Savage MRN: [id number] DOB: 12-16-1965 PCP: Briscoe Deutscher, DO     Subjective:  Chief Complaint  Patient presents with  . Dizziness    x 1 month  . left sided tremor    family h/o Parkinson's     HPI: The patient is a 52 y.o. female who presents today for multiple complaints.   Dizziness: started within the last month. No dizziness with positional changes or head turning. She states she is not sure that it's even dizzy. She feels like it is more disorientation. She has no vision changes with these episodes.   She has been having "episodes" and thinks all of her symptoms are related. She feels like her tremor gets worse and she can't control her hand like she wants to. She feels the tremor go up her neck on the left side and it affects her tongue, her cheek, her eye and feels headache coming on from it. She will get a headache normally after one of these episodes. Ibuprofen does not help and she normally has to sleep it off. She is right handed. With these episodes she will have the disorientation feeling. These episodes can last all day and she can go to bed with it. She states it hasn't effected her coordination and her strength, but her limbs feel weaker. She is not sure how often she has had these episodes. She thinks it has been about 3-4x/month. Her tremor is not so bad she can not do her ADLs. She can tell that the tremor gets worse with intentional movements. When the flair is not as bad she can still feel it shaking. Can not visually see any movement on her face/tongue etc..   Of note, she has had a tremor since college. +FH of tremors in her dad and her paternal grandfather. Both had what sounds like essential tremor. She has an uncle that was diagnosed with PD recently.   Review of Systems  Constitutional: Positive for activity change, appetite change and fatigue.  Respiratory: Negative for shortness of breath.   Cardiovascular: Negative for chest pain.   Gastrointestinal: Negative for abdominal pain and nausea.  Neurological: Positive for tremors, speech difficulty, weakness, numbness and headaches. Negative for dizziness.  Psychiatric/Behavioral: Positive for sleep disturbance. Negative for confusion.    Allergies Patient is allergic to aspirin; codeine; and latex.  Past Medical History Patient  has a past medical history of Childhood asthma.  Surgical History Patient  has a past surgical history that includes Cesarean section (2001); Knee arthroscopy; Abdominal hysterectomy (05/28/2011); and Salpingoophorectomy (05/28/2011).  Family History Pateint's family history is not on file.  Social History Patient  reports that she has never smoked. She has never used smokeless tobacco. She reports that she drinks alcohol. She reports that she does not use drugs.    Objective: Vitals:   06/12/18 1114  BP: 116/88  Pulse: 80  Temp: 97.6 F (36.4 C)  TempSrc: Oral  SpO2: 99%  Weight: 160 lb 12.8 oz (72.9 kg)  Height: 5\' 2"  (1.575 m)    Body mass index is 29.41 kg/m.  Physical Exam  Constitutional: She is oriented to person, place, and time. She appears well-developed and well-nourished.  HENT:  Right Ear: External ear normal.  Left Ear: External ear normal.  Mouth/Throat: Oropharynx is clear and moist.  Tm pearly with light reflex bilaterally    Eyes: Pupils are equal, round, and reactive to light. Conjunctivae and EOM are normal.  Neck: Normal range of motion.  Neck supple. No thyromegaly present.  Cardiovascular: Normal rate, regular rhythm and normal heart sounds.  Pulmonary/Chest: Effort normal and breath sounds normal.  Abdominal: Soft. Bowel sounds are normal.  Lymphadenopathy:    She has no cervical adenopathy.  Neurological: She is alert and oriented to person, place, and time. She displays normal reflexes. No cranial nerve deficit or sensory deficit. She exhibits normal muscle tone. Coordination normal.  Vitals  reviewed.      Assessment/plan: 1. Tremor Appears to be essential tremor. Likely made worse during these episodes due to anxiety. Declines medication at this time as she has carpal tunnel surgery next week and will wait until sees neurology. Unsure if the tremor and other symptoms of these episodes are related or if the tremor gets worse due to anxiety from the episodes.  - Ambulatory referral to Neurology  2. Multiple neurological symptoms Odd episodes. Differential could be trigeminal neuralgia although no pain and just the headache, other neurological disease, anxiety, etc. Likely needs MRI, but will defer to neurology at this time. Appointment in 2-4 weeks. Checking labs today.  - Ambulatory referral to Neurology - Vitamin B12 - VITAMIN D 25 Hydroxy (Vit-D Deficiency, Fractures) - C-reactive protein - Sedimentation rate    Return if symptoms worsen or fail to improve.   Orma Flaming, MD [place]   06/12/2018

## 2018-06-13 ENCOUNTER — Telehealth: Payer: Self-pay | Admitting: Family Medicine

## 2018-06-13 ENCOUNTER — Other Ambulatory Visit: Payer: Self-pay | Admitting: Family Medicine

## 2018-06-13 ENCOUNTER — Encounter: Payer: Self-pay | Admitting: Neurology

## 2018-06-13 MED ORDER — VITAMIN D (ERGOCALCIFEROL) 1.25 MG (50000 UNIT) PO CAPS: ORAL_CAPSULE | ORAL | 0 refills | Status: DC

## 2018-06-13 NOTE — Telephone Encounter (Signed)
Charted in result notes. 

## 2018-06-13 NOTE — Telephone Encounter (Signed)
Copied from [place] [id number]. Topic: General - Other >> Jun 13, 2018  5:13 PM Yvette Rack wrote: Reason for CRM: Pt returned call for lab results. Pt requests call back.

## 2018-08-06 ENCOUNTER — Telehealth: Payer: Self-pay | Admitting: Family Medicine

## 2018-08-06 NOTE — Telephone Encounter (Signed)
Pt calling regarding 06/12/18 results that were previously given. Pt asking why she was on Vit D capsule and why she is she has a vitamin D deficiency. Attempted to explain to pt the reasons someone could be vitamin D deficient and the purpose of taking the supplements but pt is requesting further explanation from Dr. Juleen China.

## 2018-08-06 NOTE — Telephone Encounter (Signed)
See note

## 2018-08-06 NOTE — Telephone Encounter (Signed)
Called and spoke with patient to clarify why she was taking a Vit D supplement and to follow up w/Dr. Juleen China w/any further questions/concerns.  Pt verbalized understanding.

## 2018-08-06 NOTE — Telephone Encounter (Signed)
This is in regards to labs Dr. Rogers Blocker did on patient. Do you want to call her. I am not familiar with patient or visit.

## 2018-08-06 NOTE — Telephone Encounter (Signed)
Copied from [place] [id number]. Topic: Quick Communication - See Telephone Encounter >> Aug 06, 2018  2:04 PM Sheran Luz wrote: CRM for notification. See Telephone encounter for: 08/06/18.  Patient called inquiring as to why she is taking the medication Vitamin D, Ergocalciferol, (DRISDOL) 1.25 MG (50000 UT) CAPS capsule, stating it was never explained to her why she needed to take it. Patient is requesting a call back from nurse to discuss the documented reason for being prescribed this medication. Please advise.

## 2018-08-21 NOTE — Progress Notes (Signed)
NEUROLOGY CONSULTATION NOTE  Dana Savage MRN: [id number] DOB: 09/25/1965  Referring provider: Briscoe Deutscher, DO Primary care provider: Briscoe Deutscher, DO  Reason for consult:  Tremor and multiple neurologic symptoms  HISTORY OF PRESENT ILLNESS: Dana Savage is a 53 year old right-handed female who presents for tremor and multiple neurological symptoms.  History supplemented by referring provider note.  She has history of tremors since college.  They involve her hands and noticeable with action.  Family history is notable for tremor in her father and paternal grandfather.  Her uncle was diagnosed with Parkinson's disease.  It has progressively gotten worse over past 2 years.  She has trouble eating with utensils or holding a cup or may affect her putting on makeup.  Otherwise, everyday activity is not affected.  He started experiencing episodes of right facial tic.  She notes episodes of worsening tremor in which it is so severe that she cannot control her left hand.  She has a feeling of the tremor radiating up the left side of her neck, face and tongue but no actual tremor is visually noticeable.  During these episodes, she would worry if she would lose control of her body.  No visual disturbance or unilateral numbness or weakness.  She thinks her speech sounds slurred but others don't notice it.  They typically last all day and occur 3 to 4 times a month.  The next day, she feels back to normal.  She does report stress.  06/12/18 LABS:  B12 571; D 26.24;  TSH 1.56; Sed Rate 61, CRP 0.9; CBC and CMP unremarkable.  PAST MEDICAL HISTORY: Past Medical History:  Diagnosis Date  . Childhood asthma     PAST SURGICAL HISTORY: Past Surgical History:  Procedure Laterality Date  . ABDOMINAL HYSTERECTOMY  05/28/2011   Procedure: HYSTERECTOMY ABDOMINAL;  Surgeon: Catha Brow;  Location: [facility];  Service: Gynecology;  Laterality: N/A;  . CESAREAN SECTION  2001  . KNEE ARTHROSCOPY    .  SALPINGOOPHORECTOMY  05/28/2011   Procedure: SALPINGO OOPHERECTOMY;  Surgeon: Catha Brow;  Location: [facility];  Service: Gynecology;  Laterality: Left;    MEDICATIONS: Current Outpatient Medications on File Prior to Visit  Medication Sig Dispense Refill  . albuterol (PROVENTIL HFA;VENTOLIN HFA) 108 (90 Base) MCG/ACT inhaler Inhale 2 puffs into the lungs every 6 (six) hours as needed for wheezing or shortness of breath. 1 Inhaler 0  . fluticasone (FLONASE) 50 MCG/ACT nasal spray Place 1 spray into both nostrils at bedtime.    . Vitamin D, Ergocalciferol, (DRISDOL) 1.25 MG (50000 UT) CAPS capsule One capsule by mouth once a week for 8 weeks. Then take 1000IU/day 8 capsule 0   No current facility-administered medications on file prior to visit.     ALLERGIES: Allergies  Allergen Reactions  . Aspirin Hives    Pt tolerates ibuprofen  . Codeine     Extreme vomiting  . Latex Itching    FAMILY HISTORY: Father:  Essential tremor Grandfather:  Essential tremor Uncle:  Parkinson's disease  SOCIAL HISTORY: Social History   Socioeconomic History  . Marital status: Widowed    Spouse name: Not on file  . Number of children: Not on file  . Years of education: Not on file  . Highest education level: Not on file  Occupational History  . Not on file  Social Needs  . Financial resource strain: Not on file  . Food insecurity:    Worry: Not on file  Inability: Not on file  . Transportation needs:    Medical: Not on file    Non-medical: Not on file  Tobacco Use  . Smoking status: Never Smoker  . Smokeless tobacco: Never Used  Substance and Sexual Activity  . Alcohol use: Yes    Comment: occasional wine  . Drug use: No  . Sexual activity: Not on file  Lifestyle  . Physical activity:    Days per week: Not on file    Minutes per session: Not on file  . Stress: Not on file  Relationships  . Social connections:    Talks on phone: Not on file    Gets together: Not on file     Attends religious service: Not on file    Active member of club or organization: Not on file    Attends meetings of clubs or organizations: Not on file    Relationship status: Not on file  . Intimate partner violence:    Fear of current or ex partner: Not on file    Emotionally abused: Not on file    Physically abused: Not on file    Forced sexual activity: Not on file  Other Topics Concern  . Not on file  Social History Narrative  . Not on file    REVIEW OF SYSTEMS: Constitutional: No fevers, chills, or sweats, no generalized fatigue, change in appetite Eyes: No visual changes, double vision, eye pain Ear, nose and throat: No hearing loss, ear pain, nasal congestion, sore throat Cardiovascular: No chest pain, palpitations Respiratory:  No shortness of breath at rest or with exertion, wheezes GastrointestinaI: No nausea, vomiting, diarrhea, abdominal pain, fecal incontinence Genitourinary:  No dysuria, urinary retention or frequency Musculoskeletal:  No neck pain, back pain Integumentary: No rash, pruritus, skin lesions Neurological: as above Psychiatric: some anxiety Endocrine: No palpitations, fatigue, diaphoresis, mood swings, change in appetite, change in weight, increased thirst Hematologic/Lymphatic:  No purpura, petechiae. Allergic/Immunologic: no itchy/runny eyes, nasal congestion, recent allergic reactions, rashes  PHYSICAL EXAM: Vitals:  Blood pressure 104/72, pulse 92, height 5\' 2"  (1.575 m), weight 161 lb (73 kg), last menstrual period 04/22/2011, SpO2 98 %. General: No acute distress.  Patient appears well-groomed.   Head:  Normocephalic/atraumatic Eyes:  fundi examined but not visualized Neck: supple, no paraspinal tenderness, full range of motion Back: No paraspinal tenderness Heart: regular rate and rhythm Lungs: Clear to auscultation bilaterally. Vascular: No carotid bruits. Neurological Exam: Mental status: alert and oriented to person, place, and time,  recent and remote memory intact, fund of knowledge intact, attention and concentration intact, speech fluent and not dysarthric, language intact. Cranial nerves: CN I: not tested CN II: pupils equal, round and reactive to light, visual fields intact CN III, IV, VI:  full range of motion, no nystagmus, no ptosis CN V: facial sensation intact CN VII: upper and lower face symmetric CN VIII: hearing intact CN IX, X: gag intact, uvula midline CN XI: sternocleidomastoid and trapezius muscles intact CN XII: tongue midline Bulk & Tone: normal, no fasciculations. Motor:  5/5 throughout  Sensation:  temperature and vibration sensation intact.   Deep Tendon Reflexes:  2+ throughout, toes downgoing.   Finger to nose testing:  Without dysmetria.   Heel to shin:  Without dysmetria.   Gait:  Normal station and stride.  Able to turn and tandem walk. Romberg negative.  IMPRESSION: 1.  Benign familial tremor 2.  Episodes of subjective right facial tremor.  Likely stress-induced.  I reassured her that it  was not likely a neurologic disorder.  PLAN: 1.  To treat tremor, start primidone 50mg  at bedtime.  She will contact me in 4 weeks if she would like to increase dose (100mg  at bedtime) 2.  Follow up in 5 months.  Thank you for allowing me to take part in the care of this patient.  Metta Clines, DO  CC: Briscoe Deutscher, DO

## 2018-08-22 ENCOUNTER — Encounter: Payer: Self-pay | Admitting: Neurology

## 2018-08-22 ENCOUNTER — Encounter

## 2018-08-22 ENCOUNTER — Ambulatory Visit (INDEPENDENT_AMBULATORY_CARE_PROVIDER_SITE_OTHER): Payer: BLUE CROSS/BLUE SHIELD | Admitting: Neurology

## 2018-08-22 VITALS — BP 104/72 | HR 92 | Ht 62.0 in | Wt 161.0 lb

## 2018-08-22 DIAGNOSIS — G250 Essential tremor: Secondary | ICD-10-CM | POA: Diagnosis not present

## 2018-08-22 MED ORDER — PRIMIDONE 50 MG PO TABS: 50.0000 mg | ORAL_TABLET | Freq: Every day | ORAL | 4 refills | Status: DC

## 2018-08-22 NOTE — Patient Instructions (Signed)
1.  Start primidone 50mg  at bedtime.  Contact me in 4 weeks with update and we can increase dose if needed. 2.  Follow up in 5 months.

## 2018-08-27 NOTE — Progress Notes (Signed)
Called patient made CPE

## 2018-09-25 NOTE — Progress Notes (Signed)
Subjective:    Dana Savage is a 53 y.o. female and is here for a comprehensive physical exam.  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  Because of this, she sometimes needs to feel available at night.  She admits that her sleep schedule is off and that she rarely sleeps more than 4 hours a night.  She has daytime fatigue.  She is not exercising.  History of benign essential tremor and was prescribed primidone by neurology.  After reading the side effect profile, she decided not to take it.  She does have worsening tremors.  Carpal tunnel, right-sided has resolved status post surgery.  She declines mammograms now in the future.  Neurology did evaluate the patient for her right facial tremor and was told that it was likely due to stress.  She is a caregiver for her mother.  She states that she is not her mother's favorite child.  She feels resentment from her mother.  Patient is a widow, husband died from cancer.  She has a son that is at [employer].  Health Maintenance Due  Topic Date Due  . HIV Screening  05/08/1980  . COLONOSCOPY  05/09/2015   Current Outpatient Medications:  .  albuterol (PROVENTIL HFA;VENTOLIN HFA) 108 (90 Base) MCG/ACT inhaler, Inhale 2 puffs into the lungs every 6 (six) hours as needed for wheezing or shortness of breath., Disp: 1 Inhaler, Rfl: 0 .  fluticasone (FLONASE) 50 MCG/ACT nasal spray, Place 1 spray into both nostrils at bedtime., Disp: , Rfl:  .  Multiple Vitamin (MULTIVITAMIN) tablet, Take 1 tablet by mouth daily., Disp: , Rfl:   PMHx, SurgHx, SocialHx, Medications, and Allergies were reviewed in the Visit Navigator and updated as appropriate.   Past Medical History:  Diagnosis Date  . Childhood asthma      Past Surgical History:  Procedure Laterality Date  . ABDOMINAL HYSTERECTOMY  05/28/2011   Procedure: HYSTERECTOMY ABDOMINAL;  Surgeon: Catha Brow;  Location: [facility];  Service: Gynecology;  Laterality: N/A;  . CESAREAN  SECTION  2001  . KNEE ARTHROSCOPY    . SALPINGOOPHORECTOMY  05/28/2011   Procedure: SALPINGO OOPHERECTOMY;  Surgeon: Catha Brow;  Location: [facility];  Service: Gynecology;  Laterality: Left;     Family History  Problem Relation Age of Onset  . Heart attack Mother   . Prostate cancer Father   . Prostate cancer Brother   . Parkinson's disease Paternal Uncle     Social History   Tobacco Use  . Smoking status: Never Smoker  . Smokeless tobacco: Never Used  Substance Use Topics  . Alcohol use: Yes    Comment: occasional wine  . Drug use: No    Review of Systems:   Pertinent items are noted in the HPI. Otherwise, ROS is negative.  Objective:   BP 104/72   Pulse 72   Temp 98.6 F (37 C) (Oral)   Ht 5\' 2"  (1.575 m)   Wt 162 lb (73.5 kg)   LMP 04/22/2011 (LMP Unknown)   SpO2 97%   BMI 29.63 kg/m   General appearance: alert, cooperative and appears stated age. Head: normocephalic, without obvious abnormality, atraumatic. Neck: no adenopathy, supple, symmetrical, trachea midline; thyroid not enlarged, symmetric, no tenderness/mass/nodules. Lungs: clear to auscultation bilaterally. Heart: regular rate and rhythm Abdomen: soft, non-tender; no masses,  no organomegaly. Extremities: extremities normal, atraumatic, no cyanosis or edema. Skin: skin color, texture, turgor normal, no rashes or lesions. Lymph: cervical, supraclavicular,  and axillary nodes normal; no abnormal inguinal nodes palpated. Neurologic: grossly normal.                                      Assessment/Plan:   Dana Savage was seen today for annual exam.  Diagnoses and all orders for this visit:  Routine physical examination -     Cancel: CBC with Differential/Platelet; Future -     Cancel: Comprehensive metabolic panel; Future -     Cancel: Lipid panel; Future -     Cancel: TSH; Future -     Cancel: HIV Antibody (routine testing w rflx); Future  Screening for colon cancer -     Cologuard -     Cancel:  CBC with Differential/Platelet; Future -     Cancel: Comprehensive metabolic panel; Future -     Cancel: Lipid panel; Future -     Cancel: TSH; Future -     Cancel: HIV Antibody (routine testing w rflx); Future  Overweight (BMI 25.0-29.9)  Benign essential tremor -     propranolol (INDERAL) 10 MG tablet; Take 1 tablet (10 mg total) by mouth 3 (three) times daily as needed.  Primary insomnia -     traZODone (DESYREL) 50 MG tablet; Take 0.5-1 tablets (25-50 mg total) by mouth at bedtime as needed for sleep.  Shifting sleep-work schedule -     traZODone (DESYREL) 50 MG tablet; Take 0.5-1 tablets (25-50 mg total) by mouth at bedtime as needed for sleep.  Malaise and fatigue -     CBC with Differential/Platelet; Future -     Comprehensive metabolic panel; Future -     TSH; Future -     TSH -     Comprehensive metabolic panel -     CBC with Differential/Platelet  Screening for HIV (human immunodeficiency virus) -     HIV Antibody (routine testing w rflx); Future -     HIV Antibody (routine testing w rflx)  Screening for lipid disorders -     Lipid panel; Future -     Lipid panel    Patient Counseling: [x]    Nutrition: Stressed importance of moderation in sodium/caffeine intake, saturated fat and cholesterol, caloric balance, sufficient intake of fresh fruits, vegetables, fiber, calcium, iron, and 1 mg of folate supplement per day (for females capable of pregnancy).  [x]    Stressed the importance of regular exercise.   [x]    Substance Abuse: Discussed cessation/primary prevention of tobacco, alcohol, or other drug use; driving or other dangerous activities under the influence; availability of treatment for abuse.   [x]    Injury prevention: Discussed safety belts, safety helmets, smoke detector, smoking near bedding or upholstery.   [x]    Sexuality: Discussed sexually transmitted diseases, partner selection, use of condoms, avoidance of unintended pregnancy  and contraceptive  alternatives.  [x]    Dental health: Discussed importance of regular tooth brushing, flossing, and dental visits.  [x]    Health maintenance and immunizations reviewed. Please refer to Health maintenance section.   Briscoe Deutscher, DO [place]

## 2018-09-26 ENCOUNTER — Ambulatory Visit (INDEPENDENT_AMBULATORY_CARE_PROVIDER_SITE_OTHER): Payer: BLUE CROSS/BLUE SHIELD | Admitting: Family Medicine

## 2018-09-26 VITALS — BP 104/72 | HR 72 | Temp 98.6°F | Ht 62.0 in | Wt 162.0 lb

## 2018-09-26 DIAGNOSIS — R5381 Other malaise: Secondary | ICD-10-CM

## 2018-09-26 DIAGNOSIS — Z1211 Encounter for screening for malignant neoplasm of colon: Secondary | ICD-10-CM

## 2018-09-26 DIAGNOSIS — R5383 Other fatigue: Secondary | ICD-10-CM

## 2018-09-26 DIAGNOSIS — G250 Essential tremor: Secondary | ICD-10-CM

## 2018-09-26 DIAGNOSIS — F5101 Primary insomnia: Secondary | ICD-10-CM

## 2018-09-26 DIAGNOSIS — Z0000 Encounter for general adult medical examination without abnormal findings: Secondary | ICD-10-CM

## 2018-09-26 DIAGNOSIS — G4726 Circadian rhythm sleep disorder, shift work type: Secondary | ICD-10-CM

## 2018-09-26 DIAGNOSIS — Z114 Encounter for screening for human immunodeficiency virus [HIV]: Secondary | ICD-10-CM

## 2018-09-26 DIAGNOSIS — E663 Overweight: Secondary | ICD-10-CM

## 2018-09-26 DIAGNOSIS — Z13220 Encounter for screening for lipoid disorders: Secondary | ICD-10-CM

## 2018-09-26 MED ORDER — TRAZODONE HCL 50 MG PO TABS: 25.0000 mg | ORAL_TABLET | Freq: Every evening | ORAL | 3 refills | Status: DC | PRN

## 2018-09-26 MED ORDER — PROPRANOLOL HCL 10 MG PO TABS: 10.0000 mg | ORAL_TABLET | Freq: Three times a day (TID) | ORAL | 2 refills | Status: DC | PRN

## 2018-09-27 ENCOUNTER — Encounter: Payer: Self-pay | Admitting: Family Medicine

## 2018-09-27 LAB — LIPID PANEL
Cholesterol: 193 mg/dL (ref <200)
HDL: 60 mg/dL (ref >=50)
LDL Cholesterol (Calc): 112 mg/dL (calc) — ABNORMAL HIGH
Non-HDL Cholesterol (Calc): 133 mg/dL (calc) — ABNORMAL HIGH (ref <130)
Total CHOL/HDL Ratio: 3.2 (calc) (ref <5.0)
Triglycerides: 100 mg/dL (ref <150)

## 2018-09-27 LAB — COMPREHENSIVE METABOLIC PANEL
AG Ratio: 1.4 (calc) (ref 1.0–2.5)
ALT: 23 U/L (ref 6–29)
AST: 19 U/L (ref 10–35)
Albumin: 4.3 g/dL (ref 3.6–5.1)
Alkaline phosphatase (APISO): 84 U/L (ref 37–153)
BUN: 18 mg/dL (ref 7–25)
CO2: 27 mmol/L (ref 20–32)
Calcium: 9.6 mg/dL (ref 8.6–10.4)
Chloride: 105 mmol/L (ref 98–110)
Creat: 0.74 mg/dL (ref 0.50–1.05)
Globulin: 3.1 g/dL (calc) (ref 1.9–3.7)
Glucose, Bld: 105 mg/dL — ABNORMAL HIGH (ref 65–99)
Potassium: 4.4 mmol/L (ref 3.5–5.3)
Sodium: 141 mmol/L (ref 135–146)
Total Bilirubin: 0.3 mg/dL (ref 0.2–1.2)
Total Protein: 7.4 g/dL (ref 6.1–8.1)

## 2018-09-27 LAB — HIV ANTIBODY (ROUTINE TESTING W REFLEX): HIV 1&2 Ab, 4th Generation: NONREACTIVE

## 2018-09-27 LAB — CBC WITH DIFFERENTIAL/PLATELET
Absolute Monocytes: 508 cells/uL (ref 200–950)
Basophils Absolute: 41 cells/uL (ref 0–200)
Basophils Relative: 0.5 %
Eosinophils Absolute: 148 cells/uL (ref 15–500)
Eosinophils Relative: 1.8 %
HCT: 38.1 % (ref 35.0–45.0)
Hemoglobin: 12.4 g/dL (ref 11.7–15.5)
Lymphs Abs: 2353 cells/uL (ref 850–3900)
MCH: 26.1 pg — ABNORMAL LOW (ref 27.0–33.0)
MCHC: 32.5 g/dL (ref 32.0–36.0)
MCV: 80.2 fL (ref 80.0–100.0)
MPV: 9.8 fL (ref 7.5–12.5)
Monocytes Relative: 6.2 %
Neutro Abs: 5150 cells/uL (ref 1500–7800)
Neutrophils Relative %: 62.8 %
Platelets: 345 10*3/uL (ref 140–400)
RBC: 4.75 10*6/uL (ref 3.80–5.10)
RDW: 12.9 % (ref 11.0–15.0)
Total Lymphocyte: 28.7 %
WBC: 8.2 10*3/uL (ref 3.8–10.8)

## 2018-09-27 LAB — TSH: TSH: 1.03 mIU/L

## 2018-10-09 ENCOUNTER — Other Ambulatory Visit: Payer: Self-pay | Admitting: Family Medicine

## 2018-11-01 ENCOUNTER — Other Ambulatory Visit: Payer: Self-pay | Admitting: Family Medicine

## 2018-11-10 ENCOUNTER — Other Ambulatory Visit: Payer: Self-pay

## 2018-11-10 ENCOUNTER — Ambulatory Visit (INDEPENDENT_AMBULATORY_CARE_PROVIDER_SITE_OTHER): Payer: BLUE CROSS/BLUE SHIELD | Admitting: Family Medicine

## 2018-11-10 ENCOUNTER — Encounter: Payer: Self-pay | Admitting: Family Medicine

## 2018-11-10 VITALS — Ht 61.0 in | Wt 162.0 lb

## 2018-11-10 DIAGNOSIS — L237 Allergic contact dermatitis due to plants, except food: Secondary | ICD-10-CM | POA: Diagnosis not present

## 2018-11-10 MED ORDER — MUPIROCIN 2 % EX OINT
TOPICAL_OINTMENT | CUTANEOUS | 0 refills | Status: DC
Start: 2018-11-10 — End: 2018-11-14

## 2018-11-10 MED ORDER — HYDROXYZINE HCL 25 MG PO TABS: 25.0000 mg | ORAL_TABLET | Freq: Three times a day (TID) | ORAL | 0 refills | Status: DC

## 2018-11-10 MED ORDER — PREDNISONE 5 MG PO TABS: ORAL_TABLET | ORAL | 0 refills | Status: DC

## 2018-11-10 NOTE — Progress Notes (Signed)
Virtual Visit via Video   I connected with Dana Savage on 11/10/18 at 11:20 AM EDT by a video enabled telemedicine application and verified that I am speaking with the correct person using two identifiers. Location patient: Home Location provider: [facility], Office Persons participating in the virtual visit: Dana Savage, Briscoe Deutscher, DO Dana Savage, CMA acting as scribe for Dr. Briscoe Deutscher.   I discussed the limitations of evaluation and management by telemedicine and the availability of in person appointments. The patient expressed understanding and agreed to proceed.  Subjective:   HPI: Patient has rash that is on both arms and abdomen. She was working in the yard and it started soon after. Right arm is red and drainage. She has kept covered. It has gotten worse over time. It started about 10 days ago.   Reviewed all precautions and expectations with prevention of Covid-19.  ROS: See pertinent positives and negatives per HPI.  Patient Active Problem List   Diagnosis Date Noted  . Carpal tunnel syndrome of left wrist 02/21/2018  . Family history of myocardial infarction 05/07/2017  . Plantar fasciitis 05/07/2017  . Overweight (BMI 25.0-29.9) 05/07/2017  . Allergic rhinitis 07/08/2012  . Mild asthma 07/08/2012    Social History   Tobacco Use  . Smoking status: Never Smoker  . Smokeless tobacco: Never Used  Substance Use Topics  . Alcohol use: Yes    Comment: occasional wine   Current Outpatient Medications:  .  fluticasone (FLONASE) 50 MCG/ACT nasal spray, Place 1 spray into both nostrils at bedtime., Disp: , Rfl:  .  Multiple Vitamin (MULTIVITAMIN) tablet, Take 1 tablet by mouth daily., Disp: , Rfl:  .  propranolol (INDERAL) 10 MG tablet, Take 1 tablet (10 mg total) by mouth 3 (three) times daily as needed., Disp: 30 tablet, Rfl: 2 .  traZODone (DESYREL) 50 MG tablet, Take 0.5-1 tablets (25-50 mg total) by mouth at bedtime as needed for sleep., Disp: 20  tablet, Rfl: 3  Allergies  Allergen Reactions  . Aspirin Hives    Pt tolerates ibuprofen  . Codeine     Extreme vomiting  . Latex Itching    Objective:   VITALS: Per patient if applicable, see vitals. GENERAL: Alert, appears well and in no acute distress. HEENT: Atraumatic, conjunctiva clear, no obvious abnormalities on inspection of external nose and ears. NECK: Normal movements of the head and neck. CARDIOPULMONARY: No increased WOB. Speaking in clear sentences. I:E ratio WNL.  MS: Moves all visible extremities without noticeable abnormality. PSYCH: Pleasant and cooperative, well-groomed. Speech normal rate and rhythm. Affect is appropriate. Insight and judgement are appropriate. Attention is focused, linear, and appropriate.  NEURO: CN grossly intact. Oriented as arrived to appointment on time with no prompting. Moves both UE equally.  SKIN: Plant dermatitis right forearm, draining.   Assessment and Plan:   Dana Savage was seen today for rash.  Diagnoses and all orders for this visit:  Plant allergic contact dermatitis -     predniSONE (DELTASONE) 5 MG tablet; 6-5-4-3-2-1 -     hydrOXYzine (ATARAX/VISTARIL) 25 MG tablet; Take 1 tablet (25 mg total) by mouth 3 (three) times daily. Take one 25 mg tablet 30-60 minutes prior to bedtime for insomnia, anxiety. May increase to two tablets. -     mupirocin ointment (BACTROBAN) 2 %; Apply to effected areas twice a day   . Reviewed expectations re: course of current medical issues. . Discussed self-management of symptoms. . Outlined signs and symptoms indicating  need for more acute intervention. . Patient verbalized understanding and all questions were answered. [name] Health Maintenance issues including appropriate healthy diet, exercise, and smoking avoidance were discussed with patient. . See orders for this visit as documented in the electronic medical record.  Briscoe Deutscher, DO 11/10/2018

## 2018-11-14 ENCOUNTER — Encounter: Payer: Self-pay | Admitting: Physician Assistant

## 2018-11-14 ENCOUNTER — Encounter: Payer: Self-pay | Admitting: Family Medicine

## 2018-11-14 ENCOUNTER — Ambulatory Visit (INDEPENDENT_AMBULATORY_CARE_PROVIDER_SITE_OTHER): Payer: BLUE CROSS/BLUE SHIELD | Admitting: Physician Assistant

## 2018-11-14 ENCOUNTER — Other Ambulatory Visit: Payer: Self-pay

## 2018-11-14 DIAGNOSIS — L237 Allergic contact dermatitis due to plants, except food: Secondary | ICD-10-CM | POA: Diagnosis not present

## 2018-11-14 MED ORDER — PREDNISONE 10 MG PO TABS: 10.0000 mg | ORAL_TABLET | Freq: Every day | ORAL | 0 refills | Status: DC

## 2018-11-14 MED ORDER — MUPIROCIN 2 % EX OINT: TOPICAL_OINTMENT | CUTANEOUS | 0 refills | Status: DC

## 2018-11-14 NOTE — Progress Notes (Signed)
Virtual Visit via Video   I connected with Dana Savage on 11/14/18 at  3:20 PM EDT by a video enabled telemedicine application and verified that I am speaking with the correct person using two identifiers. Location patient: Home Location provider: [facility], Office Persons participating in the virtual visit: CORAIMA TIBBS, Inda Coke, [place]   I discussed the limitations of evaluation and management by telemedicine and the availability of in person appointments. The patient expressed understanding and agreed to proceed.  Subjective:   HPI:  Plant allergic contact dermatitis Pt was seen on 4/13 with Dr. Juleen China and was treated for plant allergic contact dermatitis. Pt has been using the bactroban ointment and is taking prednisone, she has one pill left for tomorrow. Pt said that overall the poison is looking better but still spreading in small spots but not large patches. It is spreading on her stomach (& under bra edge) and left arm. Denies concerns for infection -- no fever, chills, purulent drainage.  ROS: See pertinent positives and negatives per HPI.  Patient Active Problem List   Diagnosis Date Noted  . Carpal tunnel syndrome of left wrist 02/21/2018  . Family history of myocardial infarction 05/07/2017  . Plantar fasciitis 05/07/2017  . Overweight (BMI 25.0-29.9) 05/07/2017  . Allergic rhinitis 07/08/2012  . Mild asthma 07/08/2012    Social History   Tobacco Use  . Smoking status: Never Smoker  . Smokeless tobacco: Never Used  Substance Use Topics  . Alcohol use: Yes    Comment: occasional wine    Current Outpatient Medications:  .  albuterol (PROVENTIL HFA;VENTOLIN HFA) 108 (90 Base) MCG/ACT inhaler, INHALE 2 PUFFS INTO THE LUNGS EVERY 6 HOURS AS NEEDED FOR WHEEZING OR SHORTNESS OF BREATH., Disp: 8.5 Inhaler, Rfl: 0 .  fluticasone (FLONASE) 50 MCG/ACT nasal spray, Place 1 spray into both nostrils at bedtime., Disp: , Rfl:  .  hydrOXYzine (ATARAX/VISTARIL) 25  MG tablet, Take 1 tablet (25 mg total) by mouth 3 (three) times daily. Take one 25 mg tablet 30-60 minutes prior to bedtime for insomnia, anxiety. May increase to two tablets., Disp: 90 tablet, Rfl: 0 .  Multiple Vitamin (MULTIVITAMIN) tablet, Take 1 tablet by mouth daily., Disp: , Rfl:  .  mupirocin ointment (BACTROBAN) 2 %, Apply to affected areas twice a day, Disp: 22 g, Rfl: 0 .  propranolol (INDERAL) 10 MG tablet, Take 1 tablet (10 mg total) by mouth 3 (three) times daily as needed., Disp: 30 tablet, Rfl: 2 .  traZODone (DESYREL) 50 MG tablet, Take 0.5-1 tablets (25-50 mg total) by mouth at bedtime as needed for sleep., Disp: 20 tablet, Rfl: 3 .  predniSONE (DELTASONE) 10 MG tablet, Take 1 tablet (10 mg total) by mouth daily with breakfast., Disp: 5 tablet, Rfl: 0  Allergies  Allergen Reactions  . Aspirin Hives    Pt tolerates ibuprofen  . Codeine     Extreme vomiting  . Latex Itching    Objective:   VITALS: Per patient if applicable, see vitals. GENERAL: Alert, appears well and in no acute distress. HEENT: Atraumatic, conjunctiva clear, no obvious abnormalities on inspection of external nose and ears. NECK: Normal movements of the head and neck. CARDIOPULMONARY: No increased WOB. Speaking in clear sentences. I:E ratio WNL.  MS: Moves all visible extremities without noticeable abnormality. PSYCH: Pleasant and cooperative, well-groomed. Speech normal rate and rhythm. Affect is appropriate. Insight and judgement are appropriate. Attention is focused, linear, and appropriate.  NEURO: CN grossly intact. Oriented  as arrived to appointment on time with no prompting. Moves both UE equally.  SKIN: No obvious lesions, wounds, erythema, or cyanosis noted on face or hands. **Multiple areas of well-circumscribed erythema to bilateral forearms   Assessment and Plan:   Baani was seen today for follow up on poison.  Diagnoses and all orders for this visit:  Plant allergic contact dermatitis  -     mupirocin ointment (BACTROBAN) 2 %; Apply to affected areas twice a day  Other orders -     predniSONE (DELTASONE) 10 MG tablet; Take 1 tablet (10 mg total) by mouth daily with breakfast.   Will extend prednisone -- take 10 mg daily x 5 days. Refilled bactroban. May continue atarax at night. Start daily non-sedating antihistamine and acid medication, such as pepcid, x 2 weeks. Follow-up if any further symptoms or concerns.   . Reviewed expectations re: course of current medical issues. . Discussed self-management of symptoms. . Outlined signs and symptoms indicating need for more acute intervention. . Patient verbalized understanding and all questions were answered. [name] Health Maintenance issues including appropriate healthy diet, exercise, and smoking avoidance were discussed with patient. . See orders for this visit as documented in the electronic medical record.  I discussed the assessment and treatment plan with the patient. The patient was provided an opportunity to ask questions and all were answered. The patient agreed with the plan and demonstrated an understanding of the instructions.   The patient was advised to call back or seek an in-person evaluation if the symptoms worsen or if the condition fails to improve as anticipated.  CMA or LPN served as scribe during this visit. History, Physical, and Plan performed by medical provider. The above documentation has been reviewed and is accurate and complete.  [place], [place] 11/14/2018

## 2019-01-21 ENCOUNTER — Ambulatory Visit: Payer: BLUE CROSS/BLUE SHIELD | Admitting: Neurology

## 2019-06-19 DIAGNOSIS — Z20828 Contact with and (suspected) exposure to other viral communicable diseases: Secondary | ICD-10-CM | POA: Diagnosis not present

## 2019-07-02 DIAGNOSIS — Z20828 Contact with and (suspected) exposure to other viral communicable diseases: Secondary | ICD-10-CM | POA: Diagnosis not present

## 2019-12-07 ENCOUNTER — Other Ambulatory Visit: Payer: Self-pay | Admitting: Family Medicine

## 2019-12-07 DIAGNOSIS — Z1231 Encounter for screening mammogram for malignant neoplasm of breast: Secondary | ICD-10-CM

## 2019-12-11 ENCOUNTER — Other Ambulatory Visit: Payer: Self-pay

## 2019-12-11 ENCOUNTER — Ambulatory Visit
Admission: RE | Admit: 2019-12-11 | Discharge: 2019-12-11 | Disposition: A | Discharge status: Home / Self Care | Payer: BC Managed Care – PPO | Source: Ambulatory Visit | Attending: Family Medicine | Admitting: Family Medicine

## 2019-12-11 DIAGNOSIS — Z1231 Encounter for screening mammogram for malignant neoplasm of breast: Secondary | ICD-10-CM

## 2020-04-17 ENCOUNTER — Emergency Department (HOSPITAL_COMMUNITY): Payer: BC Managed Care – PPO

## 2020-04-17 ENCOUNTER — Other Ambulatory Visit: Payer: Self-pay

## 2020-04-17 ENCOUNTER — Encounter (HOSPITAL_COMMUNITY): Payer: Self-pay | Admitting: Emergency Medicine

## 2020-04-17 ENCOUNTER — Emergency Department (HOSPITAL_COMMUNITY)
Admission: EM | Admit: 2020-04-17 | Discharge: 2020-04-17 | Disposition: A | Discharge status: Home / Self Care | Payer: BC Managed Care – PPO | Attending: Emergency Medicine | Admitting: Emergency Medicine

## 2020-04-17 DIAGNOSIS — R079 Chest pain, unspecified: Secondary | ICD-10-CM | POA: Diagnosis not present

## 2020-04-17 DIAGNOSIS — R6884 Jaw pain: Secondary | ICD-10-CM | POA: Insufficient documentation

## 2020-04-17 DIAGNOSIS — Z5321 Procedure and treatment not carried out due to patient leaving prior to being seen by health care provider: Secondary | ICD-10-CM | POA: Insufficient documentation

## 2020-04-17 LAB — CBC
HCT: 38.5 % (ref 36.0–46.0)
Hemoglobin: 11.9 g/dL — ABNORMAL LOW (ref 12.0–15.0)
MCH: 26.6 pg (ref 26.0–34.0)
MCHC: 30.9 g/dL (ref 30.0–36.0)
MCV: 86.1 fL (ref 80.0–100.0)
Platelets: 329 10*3/uL (ref 150–400)
RBC: 4.47 MIL/uL (ref 3.87–5.11)
RDW: 13.2 % (ref 11.5–15.5)
WBC: 8 10*3/uL (ref 4.0–10.5)
nRBC: 0 % (ref 0.0–0.2)

## 2020-04-17 LAB — I-STAT BETA HCG BLOOD, ED (MC, WL, AP ONLY): I-stat hCG, quantitative: 5.3 m[IU]/mL — ABNORMAL HIGH (ref <5)

## 2020-04-17 LAB — BASIC METABOLIC PANEL
Anion gap: 11 (ref 5–15)
BUN: 17 mg/dL (ref 6–20)
CO2: 24 mmol/L (ref 22–32)
Calcium: 9.4 mg/dL (ref 8.9–10.3)
Chloride: 103 mmol/L (ref 98–111)
Creatinine, Ser: 0.7 mg/dL (ref 0.44–1.00)
GFR calc Af Amer: >60 mL/min (ref >60)
GFR calc non Af Amer: >60 mL/min (ref >60)
Glucose, Bld: 144 mg/dL — ABNORMAL HIGH (ref 70–99)
Potassium: 4 mmol/L (ref 3.5–5.1)
Sodium: 138 mmol/L (ref 135–145)

## 2020-04-17 LAB — TROPONIN I (HIGH SENSITIVITY): Troponin I (High Sensitivity): <2 ng/L (ref <18)

## 2020-04-17 NOTE — ED Notes (Signed)
Pt states she wants to leave due to wait.  Encouraged her to stay and she declined.  Assisted pt with her MyChart account and encouraged her to return if any chest pain or any new symptoms.

## 2020-04-17 NOTE — ED Notes (Signed)
Attempted to bring pt back to triage for repeat Trop and she refuses.  States since symptoms started on Thursday there is no reason to repeat her Trop.

## 2020-04-17 NOTE — ED Triage Notes (Signed)
Pt states she was in a meeting for work in [place] for 12 hours on Thursday.  Has been working at home and first time back at in person meeting in over a year.  States on the way home from [place] she started to have R jaw aching that resolved. On Friday, she felt exhausted.  At 6pm she was eating and felt like she swallowed a "gas bubble" with food.  States it still feels like it is lodged in her upper chest.  Denies nausea and vomiting.

## 2020-08-02 ENCOUNTER — Encounter: Payer: Self-pay | Admitting: Physician Assistant

## 2020-08-02 ENCOUNTER — Telehealth (INDEPENDENT_AMBULATORY_CARE_PROVIDER_SITE_OTHER): Payer: BC Managed Care – PPO | Admitting: Physician Assistant

## 2020-08-02 DIAGNOSIS — R059 Cough, unspecified: Secondary | ICD-10-CM

## 2020-08-02 MED ORDER — PREDNISONE 20 MG PO TABS: 40.0000 mg | ORAL_TABLET | Freq: Every day | ORAL | 0 refills | Status: DC

## 2020-08-02 MED ORDER — AMOXICILLIN-POT CLAVULANATE 875-125 MG PO TABS: 1.0000 | ORAL_TABLET | Freq: Two times a day (BID) | ORAL | 0 refills | Status: DC

## 2020-08-02 MED ORDER — ALBUTEROL SULFATE HFA 108 (90 BASE) MCG/ACT IN AERS: INHALATION_SPRAY | RESPIRATORY_TRACT | 2 refills | Status: DC

## 2020-08-02 NOTE — Progress Notes (Signed)
Virtual Visit via Video   I connected with Dana Savage on 08/02/20 at  7:30 AM EST by a video enabled telemedicine application and verified that I am speaking with the correct person using two identifiers. Location patient: Home Location provider: [facility], Office Persons participating in the virtual visit: KARUNA ROBIE, Inda Coke PA-C  I discussed the limitations of evaluation and management by telemedicine and the availability of in person appointments. The patient expressed understanding and agreed to proceed.  Subjective:   HPI:   Patient is requesting evaluation for URI  Symptom onset: 1 week before Christmas; significant onset 12/27  Travel/contacts: around family at Christmas   Vaccination status: fully vaccinated and has had the booster  Patient endorses the following symptoms: Fever (98.6), sinus headache, sinus congestion, ear fullness, ear pain and sore throat, slight cough   Patient denies the following symptoms: chest pain and myalgia  Treatments tried: inhalers: albuterol, 12-hour mucinex, sudafed  Patient risk factors: Current [id number] risk of complications score: 1 Smoking status: Dana Savage  reports that she has never smoked. She has never used smokeless tobacco. If female, currently pregnant? []   Yes []   No  ROS: See pertinent positives and negatives per HPI.  Patient Active Problem List   Diagnosis Date Noted  . Carpal tunnel syndrome of left wrist 02/21/2018  . Family history of myocardial infarction 05/07/2017  . Plantar fasciitis 05/07/2017  . Overweight (BMI 25.0-29.9) 05/07/2017  . Allergic rhinitis 07/08/2012  . Mild asthma 07/08/2012    Social History   Tobacco Use  . Smoking status: Never Smoker  . Smokeless tobacco: Never Used  Substance Use Topics  . Alcohol use: Yes    Comment: occasional wine    Current Outpatient Medications:  .  amoxicillin-clavulanate (AUGMENTIN) 875-125 MG tablet, Take 1 tablet by mouth 2  (two) times daily., Disp: 20 tablet, Rfl: 0 .  predniSONE (DELTASONE) 20 MG tablet, Take 2 tablets (40 mg total) by mouth daily., Disp: 10 tablet, Rfl: 0 .  albuterol (VENTOLIN HFA) 108 (90 Base) MCG/ACT inhaler, INHALE 2 PUFFS INTO THE LUNGS EVERY 6 HOURS AS NEEDED FOR WHEEZING OR SHORTNESS OF BREATH., Disp: 8.5 each, Rfl: 2 .  fluticasone (FLONASE) 50 MCG/ACT nasal spray, Place 1 spray into both nostrils at bedtime., Disp: , Rfl:  .  Multiple Vitamin (MULTIVITAMIN) tablet, Take 1 tablet by mouth daily., Disp: , Rfl:  .  mupirocin ointment (BACTROBAN) 2 %, Apply to affected areas twice a day, Disp: 22 g, Rfl: 0  Allergies  Allergen Reactions  . Aspirin Hives    Pt tolerates ibuprofen  . Codeine     Extreme vomiting  . Latex Itching    Objective:   VITALS: Per patient if applicable, see vitals. GENERAL: Alert, appears well and in no acute distress. HEENT: Atraumatic, conjunctiva clear, no obvious abnormalities on inspection of external nose and ears. NECK: Normal movements of the head and neck. CARDIOPULMONARY: No increased WOB. Speaking in clear sentences. I:E ratio WNL.  MS: Moves all visible extremities without noticeable abnormality. PSYCH: Pleasant and cooperative, well-groomed. Speech normal rate and rhythm. Affect is appropriate. Insight and judgement are appropriate. Attention is focused, linear, and appropriate.  NEURO: CN grossly intact. Oriented as arrived to appointment on time with no prompting. Moves both UE equally.  SKIN: No obvious lesions, wounds, erythema, or cyanosis noted on face or hands.  Assessment and Plan:   Diagnoses and all orders for this visit:  Cough  Other  orders -     albuterol (VENTOLIN HFA) 108 (90 Base) MCG/ACT inhaler; INHALE 2 PUFFS INTO THE LUNGS EVERY 6 HOURS AS NEEDED FOR WHEEZING OR SHORTNESS OF BREATH. -     amoxicillin-clavulanate (AUGMENTIN) 875-125 MG tablet; Take 1 tablet by mouth 2 (two) times daily. -     predniSONE (DELTASONE) 20  MG tablet; Take 2 tablets (40 mg total) by mouth daily.   No red flags on discussion, patient is not in any obvious distress during our visit. Will start oral augmentin for presumed sinus infection. I did however provide pocket rx for oral prednisone should symptoms not improve as anticipated. Refill albuterol. Discussed over the counter supportive care options, with recommendations to push fluids and rest. Reviewed return precautions including new/worsening fever, SOB, new/worsening cough or other concerns.  Recommended need to self-quarantine and practice social distancing until symptoms resolve. Discussed current recommendations for COVID testing. I recommend that patient follow-up if symptoms worsen or persist despite treatment x 7-10 days, sooner if needed.  I discussed the assessment and treatment plan with the patient. The patient was provided an opportunity to ask questions and all were answered. The patient agreed with the plan and demonstrated an understanding of the instructions.   The patient was advised to call back or seek an in-person evaluation if the symptoms worsen or if the condition fails to improve as anticipated.   [place], [place] 08/02/2020

## 2020-08-03 ENCOUNTER — Telehealth: Payer: BC Managed Care – PPO | Admitting: Physician Assistant

## 2020-08-05 ENCOUNTER — Encounter: Payer: Self-pay | Admitting: Physician Assistant

## 2020-09-05 ENCOUNTER — Telehealth: Payer: BC Managed Care – PPO | Admitting: Physician Assistant

## 2020-09-05 ENCOUNTER — Encounter: Payer: Self-pay | Admitting: Physician Assistant

## 2020-09-05 ENCOUNTER — Ambulatory Visit: Payer: BC Managed Care – PPO | Admitting: Physician Assistant

## 2020-09-05 ENCOUNTER — Other Ambulatory Visit: Payer: Self-pay

## 2020-09-05 VITALS — BP 106/78 | HR 90 | Temp 98.0°F | Wt 161.0 lb

## 2020-09-05 DIAGNOSIS — M545 Low back pain, unspecified: Secondary | ICD-10-CM | POA: Diagnosis not present

## 2020-09-05 DIAGNOSIS — R509 Fever, unspecified: Secondary | ICD-10-CM | POA: Diagnosis not present

## 2020-09-05 LAB — POCT URINALYSIS DIPSTICK
Bilirubin, UA: NEGATIVE
Blood, UA: NEGATIVE
Glucose, UA: NEGATIVE
Ketones, UA: NEGATIVE
Leukocytes, UA: NEGATIVE
Nitrite, UA: NEGATIVE
Protein, UA: NEGATIVE
Spec Grav, UA: >=1.03 — AB (ref 1.010–1.025)
Urobilinogen, UA: 0.2 E.U./dL
pH, UA: 5 (ref 5.0–8.0)

## 2020-09-05 MED ORDER — TAMSULOSIN HCL 0.4 MG PO CAPS: 0.4000 mg | ORAL_CAPSULE | Freq: Every day | ORAL | 0 refills | Status: DC

## 2020-09-05 NOTE — Progress Notes (Addendum)
Dana Savage is a 55 y.o. female here for a new problem.  I acted as a Education administrator for [employer], PA-C Anselmo Pickler, LPN   History of Present Illness:   Chief Complaint  Patient presents with  . Back Pain    HPI  Back pain Pt c/o right lower back pain, denies radiation. Pain started on Friday and getting worse. Pt says she has had a low grade fever for 9 days. Back pain is moderate, unable to rate pain. A couple of of weeks ago, had some frequency with urination and going in small amounts. This has persisted.   No hx of kidney stone or kidney infection. Has had UTI in distant past. Tested twice for COVID. Feels like she is drinking enough fluids.  Slight headache. Denies dizziness, nausea or vomiting, cough or any URI sx.  Whenever she has a low grade fever she develops back pain. Will take ibuprofen and this helps with her nightly fevers.     Past Medical History:  Diagnosis Date  . Childhood asthma      Social History   Tobacco Use  . Smoking status: Never Smoker  . Smokeless tobacco: Never Used  Substance Use Topics  . Alcohol use: Yes    Comment: occasional wine  . Drug use: No    Past Surgical History:  Procedure Laterality Date  . ABDOMINAL HYSTERECTOMY  05/28/2011   Procedure: HYSTERECTOMY ABDOMINAL;  Surgeon: Catha Brow;  Location: [facility];  Service: Gynecology;  Laterality: N/A;  . CESAREAN SECTION  2001  . KNEE ARTHROSCOPY    . SALPINGOOPHORECTOMY  05/28/2011   Procedure: SALPINGO OOPHERECTOMY;  Surgeon: Catha Brow;  Location: [facility];  Service: Gynecology;  Laterality: Left;    Family History  Problem Relation Age of Onset  . Heart attack Mother   . Prostate cancer Father   . Prostate cancer Brother   . Parkinson's disease Paternal Uncle     Allergies  Allergen Reactions  . Aspirin Hives    Pt tolerates ibuprofen  . Codeine     Extreme vomiting  . Latex Itching    Current Medications:   Current Outpatient Medications:  .   albuterol (VENTOLIN HFA) 108 (90 Base) MCG/ACT inhaler, INHALE 2 PUFFS INTO THE LUNGS EVERY 6 HOURS AS NEEDED FOR WHEEZING OR SHORTNESS OF BREATH., Disp: 8.5 each, Rfl: 2 .  fluticasone (FLONASE) 50 MCG/ACT nasal spray, Place 1 spray into both nostrils at bedtime., Disp: , Rfl:  .  Multiple Vitamin (MULTIVITAMIN) tablet, Take 1 tablet by mouth daily., Disp: , Rfl:  .  tamsulosin (FLOMAX) 0.4 MG CAPS capsule, Take 1 capsule (0.4 mg total) by mouth daily., Disp: 30 capsule, Rfl: 0   Review of Systems:   ROS  Negative unless otherwise specified per HPI.   Vitals:   Vitals:   09/05/20 1448  BP: 106/78  Pulse: 90  Temp: 98 F (36.7 C)  TempSrc: Temporal  SpO2: 97%  Weight: 161 lb (73 kg)     Body mass index is 29.45 kg/m.  Physical Exam:   Physical Exam Vitals and nursing note reviewed.  Constitutional:      General: She is not in acute distress.    Appearance: She is well-developed. She is not ill-appearing, toxic-appearing or sickly-appearing.  Cardiovascular:     Rate and Rhythm: Normal rate and regular rhythm.     Pulses: Normal pulses.     Heart sounds: Normal heart sounds, S1 normal and S2 normal.  Comments: No LE edema Pulmonary:     Effort: Pulmonary effort is normal.     Breath sounds: Normal breath sounds.  Abdominal:     General: Abdomen is flat. Bowel sounds are normal.     Palpations: Abdomen is soft.     Tenderness: There is no abdominal tenderness. There is no right CVA tenderness or left CVA tenderness.  Musculoskeletal:     Comments: No decreased ROM 2/2 pain with flexion/extension, lateral side bends, or rotation. Reproducible tenderness with deep palpation to RIGHT lumbar paraspinal muscles. No bony tenderness. No evidence of erythema, rash or ecchymosis.    Skin:    General: Skin is warm, dry and intact.  Neurological:     Mental Status: She is alert.     GCS: GCS eye subscore is 4. GCS verbal subscore is 5. GCS motor subscore is 6.  Psychiatric:         Mood and Affect: Mood and affect normal.        Speech: Speech normal.        Behavior: Behavior normal. Behavior is cooperative.     Results for orders placed or performed in visit on 09/05/20  POCT urinalysis dipstick  Result Value Ref Range   Color, UA yellow    Clarity, UA hazy    Glucose, UA Negative Negative   Bilirubin, UA negative    Ketones, UA negative    Spec Grav, UA >=1.030 (A) 1.010 - 1.025   Blood, UA negative    pH, UA 5.0 5.0 - 8.0   Protein, UA Negative Negative   Urobilinogen, UA 0.2 0.2 or 1.0 E.U./dL   Nitrite, UA negative    Leukocytes, UA Negative Negative   Appearance     Odor      Assessment and Plan:   Dana Savage was seen today for back pain.  Diagnoses and all orders for this visit:  Acute right-sided low back pain without sciatica; Fever, unspecified fever cause Urine without any significant findings other than high spec gravity. Encouraged fluids. DDx includes: UTI, kidney stone, muscle strain, among others. Will trial flomax 0.4 mg daily to see if this helps with urinary symptoms. Apply topical pain reliever creams that she knows she can tolerate in case she has muscle spasm. Trial gentle movement as tolerated. Update CBC and CMP for further evaluation. Urine culture pending. If symptoms persist, will obtain CT scan for further imaging. -     POCT urinalysis dipstick -     CBC with Differential/Platelet -     Comprehensive metabolic panel -     Urine Culture -     Novel Coronavirus, NAA ([facility])  Other orders -     tamsulosin (FLOMAX) 0.4 MG CAPS capsule; Take 1 capsule (0.4 mg total) by mouth daily.  CMA or LPN served as scribe during this visit. History, Physical, and Plan performed by medical provider. The above documentation has been reviewed and is accurate and complete.  Inda Coke, PA-C

## 2020-09-05 NOTE — Patient Instructions (Signed)
It was great to see you!  Urine shows dehydration. Culture pending -- I will be in touch when this results. Update blood work today. Check for COVID today.  Trial daily flomax to see if this helps your urinary symptoms.  Trial topical pain reliever cream to see if this helps your back.  If symptoms persist or worsen, we will get imaging.  Take care,  Inda Coke PA-C

## 2020-09-06 ENCOUNTER — Encounter: Payer: BC Managed Care – PPO | Admitting: Physician Assistant

## 2020-09-06 LAB — CBC WITH DIFFERENTIAL/PLATELET
Basophils Absolute: 0.1 10*3/uL (ref 0.0–0.1)
Basophils Relative: 0.9 % (ref 0.0–3.0)
Eosinophils Absolute: 0.2 10*3/uL (ref 0.0–0.7)
Eosinophils Relative: 1.8 % (ref 0.0–5.0)
HCT: 38.4 % (ref 36.0–46.0)
Hemoglobin: 12.7 g/dL (ref 12.0–15.0)
Lymphocytes Relative: 26.2 % (ref 12.0–46.0)
Lymphs Abs: 2.4 10*3/uL (ref 0.7–4.0)
MCHC: 33.1 g/dL (ref 30.0–36.0)
MCV: 81.9 fl (ref 78.0–100.0)
Monocytes Absolute: 0.6 10*3/uL (ref 0.1–1.0)
Monocytes Relative: 6.1 % (ref 3.0–12.0)
Neutro Abs: 6 10*3/uL (ref 1.4–7.7)
Neutrophils Relative %: 65 % (ref 43.0–77.0)
Platelets: 371 10*3/uL (ref 150.0–400.0)
RBC: 4.69 Mil/uL (ref 3.87–5.11)
RDW: 14.1 % (ref 11.5–15.5)
WBC: 9.2 10*3/uL (ref 4.0–10.5)

## 2020-09-06 LAB — URINE CULTURE
MICRO NUMBER:: 11503089
Result:: NO GROWTH
SPECIMEN QUALITY:: ADEQUATE

## 2020-09-06 LAB — COMPREHENSIVE METABOLIC PANEL
ALT: 24 U/L (ref 0–35)
AST: 23 U/L (ref 0–37)
Albumin: 4.3 g/dL (ref 3.5–5.2)
Alkaline Phosphatase: 76 U/L (ref 39–117)
BUN: 15 mg/dL (ref 6–23)
CO2: 30 mEq/L (ref 19–32)
Calcium: 9.9 mg/dL (ref 8.4–10.5)
Chloride: 103 mEq/L (ref 96–112)
Creatinine, Ser: 0.7 mg/dL (ref 0.40–1.20)
GFR: 97.42 mL/min (ref >60.00)
Glucose, Bld: 95 mg/dL (ref 70–99)
Potassium: 4.3 mEq/L (ref 3.5–5.1)
Sodium: 138 mEq/L (ref 135–145)
Total Bilirubin: 0.2 mg/dL (ref 0.2–1.2)
Total Protein: 7.9 g/dL (ref 6.0–8.3)

## 2020-09-06 LAB — SARS-COV-2, NAA 2 DAY TAT

## 2020-09-06 LAB — NOVEL CORONAVIRUS, NAA: SARS-CoV-2, NAA: NOT DETECTED

## 2020-09-07 ENCOUNTER — Encounter: Payer: Self-pay | Admitting: Physician Assistant

## 2021-01-23 DIAGNOSIS — D485 Neoplasm of uncertain behavior of skin: Secondary | ICD-10-CM | POA: Diagnosis not present

## 2021-01-23 DIAGNOSIS — L309 Dermatitis, unspecified: Secondary | ICD-10-CM | POA: Diagnosis not present

## 2021-03-14 DIAGNOSIS — L578 Other skin changes due to chronic exposure to nonionizing radiation: Secondary | ICD-10-CM | POA: Diagnosis not present

## 2021-03-14 DIAGNOSIS — L814 Other melanin hyperpigmentation: Secondary | ICD-10-CM | POA: Diagnosis not present

## 2021-03-14 DIAGNOSIS — D1801 Hemangioma of skin and subcutaneous tissue: Secondary | ICD-10-CM | POA: Diagnosis not present

## 2021-03-14 DIAGNOSIS — L821 Other seborrheic keratosis: Secondary | ICD-10-CM | POA: Diagnosis not present

## 2021-03-16 ENCOUNTER — Encounter: Payer: Self-pay | Admitting: Physician Assistant

## 2021-07-18 ENCOUNTER — Ambulatory Visit: Payer: BC Managed Care – PPO | Admitting: Family Medicine

## 2021-07-18 ENCOUNTER — Encounter: Payer: Self-pay | Admitting: Family Medicine

## 2021-07-18 VITALS — BP 110/78 | HR 86 | Temp 97.8°F | Ht 61.0 in | Wt 160.2 lb

## 2021-07-18 DIAGNOSIS — R071 Chest pain on breathing: Secondary | ICD-10-CM

## 2021-07-18 LAB — COMPREHENSIVE METABOLIC PANEL
ALT: 24 U/L (ref 0–35)
AST: 21 U/L (ref 0–37)
Albumin: 4.3 g/dL (ref 3.5–5.2)
Alkaline Phosphatase: 80 U/L (ref 39–117)
BUN: 17 mg/dL (ref 6–23)
CO2: 30 mEq/L (ref 19–32)
Calcium: 9.6 mg/dL (ref 8.4–10.5)
Chloride: 103 mEq/L (ref 96–112)
Creatinine, Ser: 0.73 mg/dL (ref 0.40–1.20)
GFR: 92.08 mL/min (ref >60.00)
Glucose, Bld: 108 mg/dL — ABNORMAL HIGH (ref 70–99)
Potassium: 3.8 mEq/L (ref 3.5–5.1)
Sodium: 141 mEq/L (ref 135–145)
Total Bilirubin: 0.3 mg/dL (ref 0.2–1.2)
Total Protein: 7.8 g/dL (ref 6.0–8.3)

## 2021-07-18 LAB — CBC
HCT: 38.8 % (ref 36.0–46.0)
Hemoglobin: 12.7 g/dL (ref 12.0–15.0)
MCHC: 32.7 g/dL (ref 30.0–36.0)
MCV: 83.2 fl (ref 78.0–100.0)
Platelets: 352 10*3/uL (ref 150.0–400.0)
RBC: 4.66 Mil/uL (ref 3.87–5.11)
RDW: 13.7 % (ref 11.5–15.5)
WBC: 6.5 10*3/uL (ref 4.0–10.5)

## 2021-07-18 MED ORDER — PREDNISONE 20 MG PO TABS: 40.0000 mg | ORAL_TABLET | Freq: Every day | ORAL | 0 refills | Status: AC

## 2021-07-18 MED ORDER — AMOXICILLIN-POT CLAVULANATE 875-125 MG PO TABS: 1.0000 | ORAL_TABLET | Freq: Two times a day (BID) | ORAL | 0 refills | Status: AC

## 2021-07-18 NOTE — Patient Instructions (Signed)
Meds have been sent the the pharmacy You can take tylenol for pain/fevers If worsening symptoms, let us know or go to the Emergency room   Have a Merry Christmas

## 2021-07-18 NOTE — Progress Notes (Signed)
Subjective:     Patient ID: Dana Savage, female    DOB: 03-06-1966, 55 y.o.   MRN: [id number]  Chief Complaint  Patient presents with   Muscle Pain    Pulled muscle in neck 10 days ago while mowing grass, has gotten worse Pain in neck, chest and shoulder blade area, lungs feel tighter     HPI- R wrist  About 10 days ago, mowed lawn-no acute pain, but then later in day, pain R chest pain to back and worse to take deep breath.  Pain L neck.   Some inc SOB so using inhaler bid and guaifenisin.  Getting worse-feels like "breathing thru phlegm". Waking her up-pain when lying on R side but moves to L. But roll back to R and ok. Moving R arm -no pain.  Deep breath,cough,sneeze-bad pain on R lung. Mild cough. No f/c/n/v/d. No runny nose/congestion "Just not feel right" no FH blood clots  Last pm felt like lymph nodes in neck and pressure face/head and home covid neg last pm. Face still a little stuffy.  Health Maintenance Due  Topic Date Due   Pneumococcal Vaccine 77-75 Years old (1 - PCV) Never done   Hepatitis C Screening  Never done   Zoster Vaccines- Shingrix (1 of 2) Never done   COLONOSCOPY (Pts 45-41yr Insurance coverage will need to be confirmed)  Never done    Past Medical History:  Diagnosis Date   Childhood asthma     Past Surgical History:  Procedure Laterality Date   ABDOMINAL HYSTERECTOMY  05/28/2011   Procedure: HYSTERECTOMY ABDOMINAL;  Surgeon: TCatha Brow  [facility];  Service: Gynecology;  Laterality: N/A;   CESAREAN SECTION  2001   KNEE ARTHROSCOPY     SALPINGOOPHORECTOMY  05/28/2011   Procedure: SALPINGO OOPHERECTOMY;  Surgeon: TCatha Brow  [facility];  Service: Gynecology;  Laterality: Left;    Outpatient Medications Prior to Visit  Medication Sig Dispense Refill   albuterol (VENTOLIN HFA) 108 (90 Base) MCG/ACT inhaler INHALE 2 PUFFS INTO THE LUNGS EVERY 6 HOURS AS NEEDED FOR WHEEZING OR SHORTNESS OF BREATH. 8.5 each 2   [facility] SUNSCREEN  SPF 30 EX apply     fluocinonide cream (LIDEX) 0.05 % Apply topically 2 (two) times daily as needed.     fluticasone (FLONASE) 50 MCG/ACT nasal spray Place 1 spray into both nostrils at bedtime.     Multiple Vitamin (MULTIVITAMIN) tablet Take 1 tablet by mouth daily.     tretinoin (RETIN-A) 04.09% cream 1 application in the evening to face (Patient not taking: Reported on 07/18/2021)     tamsulosin (FLOMAX) 0.4 MG CAPS capsule Take 1 capsule (0.4 mg total) by mouth daily. 30 capsule 0   No facility-administered medications prior to visit.    Allergies  Allergen Reactions   Aspirin Hives    Pt tolerates ibuprofen Other reaction(s): Unknown   Codeine     Extreme vomiting Other reaction(s): Unknown   Latex Itching   RWJX:BJYNWGNF/[id number] as noted in HPI      Objective:     BP 110/78    Pulse 86    Temp 97.8 F (36.6 C) (Temporal)    Ht '5\' 1"'  (1.549 m)    Wt 160 lb 4 oz (72.7 kg)    LMP 04/22/2011 (LMP Unknown)    SpO2 99%    BMI 30.28 kg/m  Wt Readings from Last 3 Encounters:  07/18/21 160 lb 4 oz (72.7 kg)  09/05/20 161 lb (73 kg)  04/17/20 160 lb (72.6 kg)        Gen: [facility] NAD [facility] HEENT: NCAT, conjunctiva not injected, sclera nonicteric NECK:  supple, no thyromegaly, no nodes, no carotid bruits TM WNL B, OP moist, no exudates  CARDIAC: RRR, S1S2+, no murmur. DP 2+B LUNGS: CTAB. No wheezes.  Some TTP R ant/post chest wall.  Some pop on R w/inspiration ABDOMEN:  BS+, soft, NTND, No HSM, no masses EXT:  no edema MSK: no gross abnormalities. Some tenderness R chest wall w/abduction R shoulder NEURO: A&O x3.  CN II-XII intact.  PSYCH: normal mood. Good eye contact  Assessment & Plan:   Problem List Items Addressed This Visit   None Visit Diagnoses     Chest pain on breathing    -  Primary   Relevant Orders   Comp Met (CMET)   CBC      Chest wall pain-may be muscular, pleurisy, pneumonia, other.  As sats are great, PE less likely.  Pt wants to wait  on CXR.  Will do labs, tx for presumptive pneumonia/pleurisy. Check labs.  Worse, ER.  No change 2-3 days, needs to do CXR.  Meds ordered this encounter  Medications   amoxicillin-clavulanate (AUGMENTIN) 875-125 MG tablet    Sig: Take 1 tablet by mouth 2 (two) times daily for 7 days.    Dispense:  14 tablet    Refill:  0   predniSONE (DELTASONE) 20 MG tablet    Sig: Take 2 tablets (40 mg total) by mouth daily with breakfast for 5 days.    Dispense:  10 tablet    Refill:  0    Wellington Hampshire., MD

## 2021-07-19 ENCOUNTER — Encounter: Payer: Self-pay | Admitting: Family Medicine

## 2021-08-07 ENCOUNTER — Other Ambulatory Visit: Payer: Self-pay

## 2021-08-07 ENCOUNTER — Other Ambulatory Visit: Payer: Self-pay | Admitting: *Deleted

## 2021-08-07 ENCOUNTER — Ambulatory Visit (HOSPITAL_COMMUNITY)
Admission: RE | Admit: 2021-08-07 | Discharge: 2021-08-07 | Disposition: A | Discharge status: Home / Self Care | Payer: BC Managed Care – PPO | Source: Ambulatory Visit | Attending: Family Medicine | Admitting: Family Medicine

## 2021-08-07 DIAGNOSIS — R0789 Other chest pain: Secondary | ICD-10-CM

## 2021-08-07 DIAGNOSIS — R079 Chest pain, unspecified: Secondary | ICD-10-CM | POA: Diagnosis not present

## 2021-08-07 NOTE — Telephone Encounter (Signed)
Called patient and informed her that she needed to have an CXR done. Order placed. Patient verbalized understanding and stated that she would try to go tonight to [facility].

## 2021-08-18 ENCOUNTER — Ambulatory Visit: Payer: BC Managed Care – PPO | Admitting: Physician Assistant

## 2021-08-18 ENCOUNTER — Encounter: Payer: Self-pay | Admitting: Physician Assistant

## 2021-08-18 ENCOUNTER — Other Ambulatory Visit: Payer: Self-pay

## 2021-08-18 VITALS — BP 110/80 | HR 75 | Temp 97.7°F | Ht 61.0 in | Wt 158.4 lb

## 2021-08-18 DIAGNOSIS — R0789 Other chest pain: Secondary | ICD-10-CM | POA: Diagnosis not present

## 2021-08-18 LAB — D-DIMER, QUANTITATIVE: D-Dimer, Quant: 0.52 mcg/mL FEU — ABNORMAL HIGH (ref <0.50)

## 2021-08-18 MED ORDER — FLUTICASONE PROPIONATE HFA 44 MCG/ACT IN AERO: 2.0000 | INHALATION_SPRAY | Freq: Two times a day (BID) | RESPIRATORY_TRACT | 12 refills | Status: DC

## 2021-08-18 NOTE — Patient Instructions (Addendum)
It was great to see you!  EKG looks okay  For today: Start daily over the counter antihistamines such as Zyrtec (cetirizine), Claritin (loratadine), Allegra (fexofenadine), or Xyzal (levocetirizine) daily  Start Flovent maintenance inhaler -- use 2 puffs in AM and 2 puffs in PM May use Albuterol inhaler as needed  If new/worsening symptoms -- go to the ER  I will be in touch with your blood work results  Take care,  Inda Coke PA-C

## 2021-08-18 NOTE — Progress Notes (Signed)
Dana Savage is a 56 y.o. female here for cough.  History of Present Illness:   Chief Complaint  Patient presents with   Cough    Pt c/o cough and chest tightness since Dec. She is just starting to expectorate milky sputum. Pt is having a lot of sinus drainage down back of throat, wheezing at night. Trouble sleeping.     HPI  Cough Dana Savage presents with c/o chest tightness that has been onset for a month. Initially pt was seen by Dr. Cherlynn Kaiser about this issue on 07/18/21 where she reported that she was experiencing chest pain that radiated to her back and worsened upon deep inhalation. Additionally she had a mild cough and felt as though she were breathing through phlegm. After no red flags were found during examination, pt expressed she wanted to hold off on having a chest x ray completed. Due to this she was prescribed prednisone 40 mg x 5 days and augmentin 125 mg twice daily x 7 days. Although she found this medication to be beneficial for her, she found that upon completion her sx returned shortly after.   Since return of sx occurred, she had CXR on 08/07/21 that was WNL. Despite this normal result, she is still experiencing chest tightness which she believes to be worsening as time goes on. Currently she reports still experiencing a mild cough as well as increased amounts of sinus drainage and wheezing sounds upon breathing. She is still having right sided back pain but this has improved significantly, transitioning from a sharp sensation through her chest to a dull one. Although her back pain has improved, she does still feel as though her chest is being squeezed with an ACE bandage.   In an effort to manage her sx, she has tried taking mucinex and using her albuterol 108 mcg base inhaler nightly which has provided minor relief. Does not feel like she has increase in pain with activity. She also has a hx of heart palpitations, but this has never been a concern for pt due to unknown triggers and  infrequency. Prior to visit, pt took an at home covid test which resulted as negative. Denies LE swelling, significant weight changes.  Past Medical History:  Diagnosis Date   Childhood asthma      Social History   Tobacco Use   Smoking status: Never   Smokeless tobacco: Never  Substance Use Topics   Alcohol use: Yes    Comment: occasional wine   Drug use: No    Past Surgical History:  Procedure Laterality Date   ABDOMINAL HYSTERECTOMY  05/28/2011   Procedure: HYSTERECTOMY ABDOMINAL;  Surgeon: Catha Brow;  Location: [facility];  Service: Gynecology;  Laterality: N/A;   CESAREAN SECTION  2001   KNEE ARTHROSCOPY     SALPINGOOPHORECTOMY  05/28/2011   Procedure: SALPINGO OOPHERECTOMY;  Surgeon: Catha Brow;  Location: [facility];  Service: Gynecology;  Laterality: Left;    Family History  Problem Relation Age of Onset   Heart attack Mother    Prostate cancer Father    Prostate cancer Brother    Parkinson's disease Paternal Uncle     Allergies  Allergen Reactions   Aspirin Hives    Pt tolerates ibuprofen Other reaction(s): Unknown   Codeine     Extreme vomiting Other reaction(s): Unknown   Latex Itching    Current Medications:   Current Outpatient Medications:    albuterol (VENTOLIN HFA) 108 (90 Base) MCG/ACT inhaler, INHALE 2 PUFFS INTO THE  LUNGS EVERY 6 HOURS AS NEEDED FOR WHEEZING OR SHORTNESS OF BREATH., Disp: 8.5 each, Rfl: 2   [facility] SUNSCREEN SPF 30 EX, apply, Disp: , Rfl:    fluocinonide cream (LIDEX) 0.05 %, Apply topically 2 (two) times daily as needed., Disp: , Rfl:    fluticasone (FLONASE) 50 MCG/ACT nasal spray, Place 1 spray into both nostrils at bedtime., Disp: , Rfl:    Multiple Vitamin (MULTIVITAMIN) tablet, Take 1 tablet by mouth daily., Disp: , Rfl:    tretinoin (RETIN-A) 0.05 % cream, , Disp: , Rfl:    Review of Systems:   ROS Negative unless otherwise specified per HPI. Vitals:   Vitals:   08/18/21 1058  BP: 110/80  Pulse: 75  Temp: 97.7 F  (36.5 C)  TempSrc: Temporal  SpO2: 95%  Weight: 158 lb 6.1 oz (71.8 kg)  Height: 5\' 1"  (1.549 m)     Body mass index is 29.93 kg/m.  Physical Exam:   Physical Exam Vitals and nursing note reviewed.  Constitutional:      General: She is not in acute distress.    Appearance: She is well-developed. She is not ill-appearing or toxic-appearing.  Cardiovascular:     Rate and Rhythm: Normal rate and regular rhythm.     Pulses: Normal pulses.     Heart sounds: Normal heart sounds, S1 normal and S2 normal.  Pulmonary:     Effort: Pulmonary effort is normal.     Breath sounds: Examination of the left-lower field reveals decreased breath sounds. Decreased breath sounds present.  Skin:    General: Skin is warm and dry.  Neurological:     Mental Status: She is alert.     GCS: GCS eye subscore is 4. GCS verbal subscore is 5. GCS motor subscore is 6.  Psychiatric:        Speech: Speech normal.        Behavior: Behavior normal. Behavior is cooperative.    Assessment and Plan:   Other chest pain EKG tracing is personally reviewed.  EKG notes NSR.  No acute changes.  No red flags on exam No significant exertional component to her symptoms Will obtain a stat D-dimer for further evaluation, if this is positive we will proceed with CTA Start use of flovent 44 mcg inhaler, 2 puffs in the AM and PM Start an OTC antihistamine such as zyrtec, claritin, allegra, or xyzal  May use albuterol 108 mcg base inhaler as needed If new/worsening symptoms occurs, visit the ER immediately   I,Havlyn C Ratchford,acting as a scribe for [employer], PA.,have documented all relevant documentation on the behalf of Dana Coke, PA,as directed by  Dana Coke, PA while in the presence of Dana Savage, [place].  I, Dana Savage, [place], have reviewed all documentation for this visit. The documentation on 08/18/21 for the exam, diagnosis, procedures, and orders are all accurate and complete.  Time spent  with patient today was 25 minutes which consisted of chart review of prior providers notes, prior EKGs, recent lab work, discussing diagnosis, work up, treatment answering questions and documentation.  Dana Coke, PA-C

## 2021-08-21 ENCOUNTER — Ambulatory Visit: Payer: BC Managed Care – PPO | Admitting: Physician Assistant

## 2021-08-22 ENCOUNTER — Encounter: Payer: Self-pay | Admitting: Physician Assistant

## 2021-12-04 ENCOUNTER — Telehealth: Payer: Self-pay | Admitting: Physician Assistant

## 2021-12-04 NOTE — Telephone Encounter (Signed)
Spoke to pt told her I am not sure how you received a call due to there is no referral placed for colonoscopy. Told pt she is overdue for a physical please schedule and can discuss Cologuard at visit. Pt verbalized understanding. ?

## 2021-12-04 NOTE — Telephone Encounter (Signed)
Pt states she received an automated call about getting a colonoscopy. She does not want to do it. But she is interested in Cologuard. Please advise ?

## 2022-02-09 ENCOUNTER — Ambulatory Visit (INDEPENDENT_AMBULATORY_CARE_PROVIDER_SITE_OTHER): Payer: BC Managed Care – PPO | Admitting: Physician Assistant

## 2022-02-09 ENCOUNTER — Encounter: Payer: Self-pay | Admitting: Physician Assistant

## 2022-02-09 VITALS — BP 110/70 | HR 77 | Temp 98.0°F | Ht 61.5 in | Wt 156.0 lb

## 2022-02-09 DIAGNOSIS — Z0000 Encounter for general adult medical examination without abnormal findings: Secondary | ICD-10-CM | POA: Diagnosis not present

## 2022-02-09 DIAGNOSIS — J4530 Mild persistent asthma, uncomplicated: Secondary | ICD-10-CM

## 2022-02-09 DIAGNOSIS — Z1159 Encounter for screening for other viral diseases: Secondary | ICD-10-CM | POA: Diagnosis not present

## 2022-02-09 DIAGNOSIS — E663 Overweight: Secondary | ICD-10-CM

## 2022-02-09 DIAGNOSIS — Z1211 Encounter for screening for malignant neoplasm of colon: Secondary | ICD-10-CM

## 2022-02-09 DIAGNOSIS — G250 Essential tremor: Secondary | ICD-10-CM | POA: Insufficient documentation

## 2022-02-09 DIAGNOSIS — G4700 Insomnia, unspecified: Secondary | ICD-10-CM

## 2022-02-09 MED ORDER — GABAPENTIN 100 MG PO CAPS: ORAL_CAPSULE | ORAL | 1 refills | Status: DC

## 2022-02-09 NOTE — Progress Notes (Signed)
Subjective:    Dana Savage is a 56 y.o. female and is here for a comprehensive physical exam.   HPI  Health Maintenance Due  Topic Date Due   Hepatitis C Screening  Never done   COLONOSCOPY (Pts 45-32yr Insurance coverage will need to be confirmed)  Never done    Acute Concerns: None  Chronic Issues: Mild asthma -- currently taking flovent twice once daily but still having intermittent tightness; not requiring use of albuterol inhaler at all; has had COVID in the past.   Insomnia -- chronic issue for her. She is fearful of missing an important phone call from son or mother in the middle of the night as she is their support person. Tried melatonin without noticeable improvement. Tried trazodone and felt like it made her too groggy.  Benign familial tremor -- saw neuro in 2020. Was told to take primidone and follow-up. She never started this. She feels like tremor is worsening.  Health Maintenance: Immunizations -- UTD Colonoscopy -- agreeable to colonoscopy Mammogram -- doesn't routinely get them due to concerns of inaccuracy due to dense breasts PAP -- n/a Bone Density -- n/a Diet -- feels like she does okay Sleep habits -- see above Exercise -- working on consistent movement; walking with dog TID Weight -- Weight: 156 lb (70.8 kg)  Mood -- overall good Weight history: Wt Readings from Last 10 Encounters:  02/09/22 156 lb (70.8 kg)  08/18/21 158 lb 6.1 oz (71.8 kg)  07/18/21 160 lb 4 oz (72.7 kg)  09/05/20 161 lb (73 kg)  04/17/20 160 lb (72.6 kg)  11/10/18 162 lb (73.5 kg)  09/26/18 162 lb (73.5 kg)  08/22/18 161 lb (73 kg)  06/12/18 160 lb 12.8 oz (72.9 kg)  05/15/17 153 lb 6.4 oz (69.6 kg)   Body mass index is 29 kg/m. Patient's last menstrual period was 04/22/2011 (lmp unknown). Alcohol use:  reports current alcohol use. Tobacco use:  Tobacco Use: Low Risk  (02/09/2022)   Patient History    Smoking Tobacco Use: Never    Smokeless Tobacco Use: Never     Passive Exposure: Not on file        02/09/2022    3:05 PM  Depression screen PHQ 2/9  Decreased Interest 0  Down, Depressed, Hopeless 0  PHQ - 2 Score 0   UTD with dentist and eye doctor  Other providers/specialists: Patient Care Team: WInda Coke PUtahas PCP - General (Physician Assistant)    PMHx, SurgHx, SocialHx, Medications, and Allergies were reviewed in the Visit Navigator and updated as appropriate.   Past Medical History:  Diagnosis Date   Childhood asthma      Past Surgical History:  Procedure Laterality Date   ABDOMINAL HYSTERECTOMY  05/28/2011   Procedure: HYSTERECTOMY ABDOMINAL;  Surgeon: TCatha Brow  [facility];  Service: Gynecology;  Laterality: N/A;   CESAREAN SECTION  2001   KNEE ARTHROSCOPY     SALPINGOOPHORECTOMY  05/28/2011   Procedure: SALPINGO OOPHERECTOMY;  Surgeon: TCatha Brow  [facility];  Service: Gynecology;  Laterality: Left;     Family History  Problem Relation Age of Onset   Heart attack Mother    Prostate cancer Father    Prostate cancer Brother    Parkinson's disease Paternal Uncle     Social History   Tobacco Use   Smoking status: Never   Smokeless tobacco: Never  Substance Use Topics   Alcohol use: Yes    Comment: occasional  wine   Drug use: No    Review of Systems:   Review of Systems  Constitutional:  Negative for chills, fever, malaise/fatigue and weight loss.  HENT:  Negative for hearing loss, sinus pain and sore throat.   Respiratory:  Negative for cough and hemoptysis.   Cardiovascular:  Negative for chest pain, palpitations, leg swelling and PND.  Gastrointestinal:  Negative for abdominal pain, constipation, diarrhea, heartburn, nausea and vomiting.  Genitourinary:  Negative for dysuria, frequency and urgency.  Musculoskeletal:  Negative for back pain, myalgias and neck pain.  Skin:  Negative for itching and rash.  Neurological:  Negative for dizziness, tingling, seizures and headaches.   Endo/Heme/Allergies:  Negative for polydipsia.  Psychiatric/Behavioral:  Negative for depression. The patient has insomnia. The patient is not nervous/anxious.     Objective:   BP 110/70 (BP Location: Left Arm, Patient Position: Sitting, Cuff Size: Normal)   Pulse 77   Temp 98 F (36.7 C) (Temporal)   Ht 5' 1.5" (1.562 m)   Wt 156 lb (70.8 kg)   LMP 04/22/2011 (LMP Unknown)   SpO2 96%   BMI 29.00 kg/m  Body mass index is 29 kg/m.   General Appearance:    Alert, cooperative, no distress, appears stated age  Head:    Normocephalic, without obvious abnormality, atraumatic  Eyes:    PERRL, conjunctiva/corneas clear, EOM's intact, fundi    benign, both eyes  Ears:    Normal TM's and external ear canals, both ears  Nose:   Nares normal, septum midline, mucosa normal, no drainage    or sinus tenderness  Throat:   Lips, mucosa, and tongue normal; teeth and gums normal  Neck:   Supple, symmetrical, trachea midline, no adenopathy;    thyroid:  no enlargement/tenderness/nodules; no carotid   bruit or JVD  Back:     Symmetric, no curvature, ROM normal, no CVA tenderness  Lungs:     Clear to auscultation bilaterally, respirations unlabored  Chest Wall:    No tenderness or deformity   Heart:    Regular rate and rhythm, S1 and S2 normal, no murmur, rub or gallop  Breast Exam:    Deferred  Abdomen:     Soft, non-tender, bowel sounds active all four quadrants,    no masses, no organomegaly  Genitalia:    Deferred  Extremities:   Extremities normal, atraumatic, no cyanosis or edema  Pulses:   2+ and symmetric all extremities  Skin:   Skin color, texture, turgor normal, no rashes or lesions  Lymph nodes:   Cervical, supraclavicular, and axillary nodes normal  Neurologic:   CNII-XII intact, normal strength, sensation and reflexes    throughout    Assessment/Plan:   Routine physical examination Today patient counseled on age appropriate routine health concerns for screening and  prevention, each reviewed and up to date or declined. Immunizations reviewed and up to date or declined. Labs ordered and reviewed. Risk factors for depression reviewed and negative. Hearing function and visual acuity are intact. ADLs screened and addressed as needed. Functional ability and level of safety reviewed and appropriate. Education, counseling and referrals performed based on assessed risks today. Patient provided with a copy of personalized plan for preventive services.  Mild persistent asthma without complication Uncontrolled Recommend using Flovent BID as prescribed Follow-up if requiring use of albuterol  Overweight (BMI 25.0-29.9) Continue healthy diet and exercise habits  Encounter for screening for other viral diseases Update Hep C  Special screening for malignant neoplasms, colon Agreeable  to Cologuard  Insomnia, unspecified type Uncontrolled Trial gabapentin 100 mg qhs, with permission to increase by 100 mg to goal of 300 mg  Benign familial tremor Uncontrolled Recommend f/u with neuro but she declined Follow-up as needed  Patient Counseling: '[x]'$    Nutrition: Stressed importance of moderation in sodium/caffeine intake, saturated fat and cholesterol, caloric balance, sufficient intake of fresh fruits, vegetables, fiber, calcium, iron, and 1 mg of folate supplement per day (for females capable of pregnancy).  '[x]'$    Stressed the importance of regular exercise.   '[x]'$    Substance Abuse: Discussed cessation/primary prevention of tobacco, alcohol, or other drug use; driving or other dangerous activities under the influence; availability of treatment for abuse.   '[x]'$    Injury prevention: Discussed safety belts, safety helmets, smoke detector, smoking near bedding or upholstery.   '[x]'$    Sexuality: Discussed sexually transmitted diseases, partner selection, use of condoms, avoidance of unintended pregnancy  and contraceptive alternatives.  '[x]'$    Dental health: Discussed  importance of regular tooth brushing, flossing, and dental visits.  '[x]'$    Health maintenance and immunizations reviewed. Please refer to Health maintenance section.   Inda Coke, PA-C [place]

## 2022-02-09 NOTE — Patient Instructions (Signed)
It was great to see you!  Take 100 mg gabapentin nightly. May increase by 100 mg nightly to goal of 300 mg.  Update blood work today.  We will order the cologuard for you.  Keep me posted on your tremor.  Take care,  Inda Coke PA-C

## 2022-02-10 ENCOUNTER — Other Ambulatory Visit: Payer: Self-pay | Admitting: Physician Assistant

## 2022-02-12 LAB — COMPREHENSIVE METABOLIC PANEL
AG Ratio: 1.4 (calc) (ref 1.0–2.5)
ALT: 20 U/L (ref 6–29)
AST: 20 U/L (ref 10–35)
Albumin: 4.5 g/dL (ref 3.6–5.1)
Alkaline phosphatase (APISO): 90 U/L (ref 37–153)
BUN: 18 mg/dL (ref 7–25)
CO2: 24 mmol/L (ref 20–32)
Calcium: 10 mg/dL (ref 8.6–10.4)
Chloride: 102 mmol/L (ref 98–110)
Creat: 0.76 mg/dL (ref 0.50–1.03)
Globulin: 3.2 g/dL (calc) (ref 1.9–3.7)
Glucose, Bld: 81 mg/dL (ref 65–99)
Potassium: 3.9 mmol/L (ref 3.5–5.3)
Sodium: 140 mmol/L (ref 135–146)
Total Bilirubin: 0.4 mg/dL (ref 0.2–1.2)
Total Protein: 7.7 g/dL (ref 6.1–8.1)

## 2022-02-12 LAB — CBC WITH DIFFERENTIAL/PLATELET
Absolute Monocytes: 462 cells/uL (ref 200–950)
Basophils Absolute: 39 cells/uL (ref 0–200)
Basophils Relative: 0.5 %
Eosinophils Absolute: 200 cells/uL (ref 15–500)
Eosinophils Relative: 2.6 %
HCT: 39 % (ref 35.0–45.0)
Hemoglobin: 12.8 g/dL (ref 11.7–15.5)
Lymphs Abs: 2649 cells/uL (ref 850–3900)
MCH: 26.7 pg — ABNORMAL LOW (ref 27.0–33.0)
MCHC: 32.8 g/dL (ref 32.0–36.0)
MCV: 81.4 fL (ref 80.0–100.0)
MPV: 9.4 fL (ref 7.5–12.5)
Monocytes Relative: 6 %
Neutro Abs: 4351 cells/uL (ref 1500–7800)
Neutrophils Relative %: 56.5 %
Platelets: 336 10*3/uL (ref 140–400)
RBC: 4.79 10*6/uL (ref 3.80–5.10)
RDW: 13.1 % (ref 11.0–15.0)
Total Lymphocyte: 34.4 %
WBC: 7.7 10*3/uL (ref 3.8–10.8)

## 2022-02-12 LAB — LIPID PANEL
Cholesterol: 209 mg/dL — ABNORMAL HIGH (ref <200)
HDL: 66 mg/dL (ref >=50)
LDL Cholesterol (Calc): 125 mg/dL (calc) — ABNORMAL HIGH
Non-HDL Cholesterol (Calc): 143 mg/dL (calc) — ABNORMAL HIGH (ref <130)
Total CHOL/HDL Ratio: 3.2 (calc) (ref <5.0)
Triglycerides: 82 mg/dL (ref <150)

## 2022-02-12 LAB — HEPATITIS C ANTIBODY: Hepatitis C Ab: NONREACTIVE

## 2022-02-15 ENCOUNTER — Ambulatory Visit (INDEPENDENT_AMBULATORY_CARE_PROVIDER_SITE_OTHER): Payer: BC Managed Care – PPO

## 2022-02-15 ENCOUNTER — Ambulatory Visit (INDEPENDENT_AMBULATORY_CARE_PROVIDER_SITE_OTHER): Payer: BC Managed Care – PPO | Admitting: Podiatry

## 2022-02-15 DIAGNOSIS — M7752 Other enthesopathy of left foot: Secondary | ICD-10-CM

## 2022-02-15 DIAGNOSIS — M205X9 Other deformities of toe(s) (acquired), unspecified foot: Secondary | ICD-10-CM | POA: Diagnosis not present

## 2022-02-15 DIAGNOSIS — M21619 Bunion of unspecified foot: Secondary | ICD-10-CM

## 2022-02-15 MED ORDER — TRIAMCINOLONE ACETONIDE 10 MG/ML IJ SUSP
10.0000 mg | Freq: Once | INTRAMUSCULAR | Status: AC
  Administered 2022-02-15: 10 mg

## 2022-02-15 NOTE — Progress Notes (Signed)
Subjective:   Patient ID: Dana Savage, female   DOB: 56 y.o.   MRN: [id number]   HPI Patient states that she has had bunion pain for over 10 years on both feet left over right and also she has developed a lot of pain in the second joint of the left foot over the last few months that has made it hard for her to walk.  States the bunion she knows she is getting need to get corrected to get sore inside the joint and also when she tries to wear shoe gear.  Patient does not smoke likes to be active and is intolerant to all pain medication   Review of Systems  All other systems reviewed and are negative.       Objective:  Physical Exam Vitals and nursing note reviewed.  Constitutional:      Appearance: She is well-developed.  Pulmonary:     Effort: Pulmonary effort is normal.  Musculoskeletal:        General: Normal range of motion.  Skin:    General: Skin is warm.  Neurological:     Mental Status: She is alert.     \Neurovascular status intact muscle strength found to be adequate range of motion within normal limits.  Patient is found to have exquisite discomfort in the first MPJ right over left with reduced range of motion and is found to have large hyperostosis medial aspect first metatarsal head left over right.  Patient does have exquisite discomfort second MPJ left with fluid buildup of short-term duration      Assessment:  Combination of structural bunion deformity along with arthritic joint of the first MPJ right over left and inflammatory capsulitis second MPJ with moderate elongation of the joint surface     Plan:  H&P reviewed all conditions and x-rays.  I do think that first MPJ fusion will probably be best for her given her combined deformity and I made her aware of that and also discussed with Dr. Sherryle Lis looking at the x-rays together.  Today I went ahead and I want to focus on the left foot and I did do a forefoot block left 60 mg like Marcaine mixture sterile prep  done and aspirated the second MPJ getting a small amount of clear fluid and injected quarter cc of dexamethasone Kenalog and advised on rigid bottom shoes.  Reappoint 2 weeks to discuss and did discuss there would be a period of nonweightbearing after surgical intervention  X-rays indicate that there is significant elevation of the intermetatarsal angle left over right and arthritic process with bone spur dorsally first MPJ right over left with elongation of the second metatarsal bone left over right

## 2022-02-20 DIAGNOSIS — Z124 Encounter for screening for malignant neoplasm of cervix: Secondary | ICD-10-CM | POA: Diagnosis not present

## 2022-02-20 DIAGNOSIS — Z01419 Encounter for gynecological examination (general) (routine) without abnormal findings: Secondary | ICD-10-CM | POA: Diagnosis not present

## 2022-02-21 DIAGNOSIS — Z1211 Encounter for screening for malignant neoplasm of colon: Secondary | ICD-10-CM | POA: Diagnosis not present

## 2022-03-01 ENCOUNTER — Ambulatory Visit: Payer: BC Managed Care – PPO

## 2022-03-01 ENCOUNTER — Ambulatory Visit: Payer: BC Managed Care – PPO | Admitting: Podiatry

## 2022-03-01 DIAGNOSIS — M205X9 Other deformities of toe(s) (acquired), unspecified foot: Secondary | ICD-10-CM

## 2022-03-01 DIAGNOSIS — M7752 Other enthesopathy of left foot: Secondary | ICD-10-CM

## 2022-03-01 DIAGNOSIS — M21619 Bunion of unspecified foot: Secondary | ICD-10-CM

## 2022-03-01 LAB — COLOGUARD: COLOGUARD: POSITIVE — AB

## 2022-03-02 ENCOUNTER — Encounter: Payer: Self-pay | Admitting: Gastroenterology

## 2022-03-02 ENCOUNTER — Other Ambulatory Visit: Payer: Self-pay | Admitting: *Deleted

## 2022-03-02 DIAGNOSIS — R195 Other fecal abnormalities: Secondary | ICD-10-CM

## 2022-03-02 NOTE — Progress Notes (Signed)
Subjective:   Patient ID: Dana Savage, female   DOB: 56 y.o.   MRN: [id number]   HPI Patient states that she did not get a significant response to the injection that we did second MPJ left.  States that it gave her temporary relief but the pain has reoccurred and she does have severe structural deformity wants to get it fixed and would like to wait till the fall   ROS      Objective:  Physical Exam  Neurovascular status intact with patient found to have significant structural bunion deformity symptomatic bilateral with reduced range of motion and pain within the first MPJ joint bilateral with quite a bit of discomfort in the second MPJ left over right with the long needed digit noted second toe bilateral     Assessment:  Significant structural deformity of both feet with a combination of structural HAV along with hallux limitus rigidus type deformity bilateral.  Also noted to have inflammatory capsulitis of the second MPJ left over right with elongated digit moderate elevation of the second toe left     Plan:  Reviewed condition at great length with patient.  I reviewed the consideration for Lapidus versus first MPJ fusion given the deformity present and I do think most likely first MPJ fusion will be her best route given the combined deformity.  I also recommend shortening osteotomy digital fusion and I discussed this case with Dr. Earleen Newport who is in agreement with me and I am referring this over to him for consultation for tentative surgery in mid October.  All questions were answered at this time we discussed a prolonged period of nonweightbearing and recovery after surgery and she will see Dr. Earleen Newport several weeks prior to the planned procedure.  Also reviewed x-rays with her again today

## 2022-03-15 DIAGNOSIS — D485 Neoplasm of uncertain behavior of skin: Secondary | ICD-10-CM | POA: Diagnosis not present

## 2022-03-15 DIAGNOSIS — L820 Inflamed seborrheic keratosis: Secondary | ICD-10-CM | POA: Diagnosis not present

## 2022-03-15 DIAGNOSIS — D2271 Melanocytic nevi of right lower limb, including hip: Secondary | ICD-10-CM | POA: Diagnosis not present

## 2022-03-15 DIAGNOSIS — L821 Other seborrheic keratosis: Secondary | ICD-10-CM | POA: Diagnosis not present

## 2022-03-15 DIAGNOSIS — L578 Other skin changes due to chronic exposure to nonionizing radiation: Secondary | ICD-10-CM | POA: Diagnosis not present

## 2022-03-15 DIAGNOSIS — D1801 Hemangioma of skin and subcutaneous tissue: Secondary | ICD-10-CM | POA: Diagnosis not present

## 2022-03-15 DIAGNOSIS — L814 Other melanin hyperpigmentation: Secondary | ICD-10-CM | POA: Diagnosis not present

## 2022-03-26 ENCOUNTER — Ambulatory Visit (AMBULATORY_SURGERY_CENTER): Payer: BC Managed Care – PPO | Admitting: *Deleted

## 2022-03-26 ENCOUNTER — Encounter: Payer: Self-pay | Admitting: Gastroenterology

## 2022-03-26 VITALS — Ht 61.5 in | Wt 153.0 lb

## 2022-03-26 DIAGNOSIS — R195 Other fecal abnormalities: Secondary | ICD-10-CM

## 2022-03-26 MED ORDER — SUTAB 1479-225-188 MG PO TABS: 1.0000 | ORAL_TABLET | ORAL | 0 refills | Status: DC

## 2022-03-26 NOTE — Progress Notes (Signed)
Patient's pre-visit was done today over the phone with the patient. Name,DOB and address verified. Patient denies any allergies to Eggs and Soy. Patient denies any problems with anesthesia/sedation. Patient is not taking any diet pills or blood thinners. No home Oxygen. Insurance confirmed with patient.  Went over prep instructions with patient. Prep instructions sent to pt's MyChart & mailed to pt-pt is aware. Patient understands to call us back with any questions or concerns. Patient is aware of our care-partner policy. Pt request pills-she does not tolerate liquid meds. Sutab used-pt aware of price-mailed coupon to pt.

## 2022-03-28 ENCOUNTER — Ambulatory Visit: Payer: BC Managed Care – PPO | Admitting: Podiatry

## 2022-03-28 DIAGNOSIS — M898X9 Other specified disorders of bone, unspecified site: Secondary | ICD-10-CM

## 2022-03-28 DIAGNOSIS — M21619 Bunion of unspecified foot: Secondary | ICD-10-CM

## 2022-03-28 DIAGNOSIS — M7752 Other enthesopathy of left foot: Secondary | ICD-10-CM | POA: Diagnosis not present

## 2022-03-28 DIAGNOSIS — M2042 Other hammer toe(s) (acquired), left foot: Secondary | ICD-10-CM

## 2022-03-28 NOTE — Patient Instructions (Signed)

## 2022-04-02 NOTE — Progress Notes (Signed)
Subjective: Chief Complaint  Patient presents with   Foot Problem    SURGERY CONSULT     56 year old female presents with the above concerns.  She presents today for surgical consultation of her left foot.  She previously was seen by Dr. Paulla Dolly.  The majority of her pain is localized along underneath the second toe.  She feels a pulling sensation.  She is also noticed a hump on the second toe.  She is also developed significant bunion which does rub and cause irritation at times.  She also has a small bone spur on the top of her foot that causes irritation.  Objective: AAO x3, NAD DP/PT pulses palpable bilaterally, CRT less than 3 seconds Significant bunion present on the left side.  Mild restriction of first MPJ range of motion.  Majority tenderness is localized along the second MPJ plantarly.  Hammertoe is also present on the second digit.  Mild dorsal prominence of the midfoot present causing tenderness mostly with shoes and with pressure.  No pain with calf compression, swelling, warmth, erythema  Assessment: Bunion, arthritis left foot first MPJ, hammertoe elongated second metatarsal  Plan: -All treatment options discussed with the patient including all alternatives, risks, complications.  -I reviewed the x-rays with her we had extensive discussion regards to conservative versus surgical intervention.  Ultimately she is attempted conservative care and she wants to proceed with surgical intervention.  I discussed with her first imaging arthrodesis given the arthritis of the first MPJ as well as shortening second metatarsal osteotomy and hammertoe repair including PIPJ arthrodesis with K wire fixation.  She also is a small bone spur on the top of her foot that causes irritation and discussed exostectomy. -After long discussion with her in regards to the surgery was postoperative course and answered all her questions she wants to proceed with surgery. -The incision placement as well as the  postoperative course was discussed with the patient. I discussed risks of the surgery which include, but not limited to, infection, bleeding, pain, swelling, need for further surgery, delayed or nonhealing, painful or ugly scar, numbness or sensation changes, over/under correction, recurrence, transfer lesions, further deformity, hardware failure, DVT/PE, loss of toe/foot. Patient understands these risks and wishes to proceed with surgery. The surgical consent was reviewed with the patient all 3 pages were signed. No promises or guarantees were given to the outcome of the procedure. All questions were answered to the best of my ability. Before the surgery the patient was encouraged to call the office if there is any further questions. The surgery will be performed at the [facility] on an outpatient basis. -Patient encouraged to call the office with any questions, concerns, change in symptoms.   45 minutes were spent with the patient.  Trula Slade DPM

## 2022-04-05 ENCOUNTER — Ambulatory Visit: Payer: BC Managed Care – PPO | Admitting: Podiatry

## 2022-04-06 ENCOUNTER — Ambulatory Visit: Payer: BC Managed Care – PPO | Admitting: Podiatry

## 2022-04-09 ENCOUNTER — Other Ambulatory Visit: Payer: Self-pay | Admitting: Physician Assistant

## 2022-04-09 ENCOUNTER — Telehealth: Payer: Self-pay | Admitting: Physician Assistant

## 2022-04-09 MED ORDER — FLUTICASONE PROPIONATE HFA 44 MCG/ACT IN AERO: 1.0000 | INHALATION_SPRAY | Freq: Two times a day (BID) | RESPIRATORY_TRACT | 12 refills | Status: DC

## 2022-04-10 NOTE — Telephone Encounter (Signed)
Left message on voicemail to call office.  

## 2022-04-11 ENCOUNTER — Other Ambulatory Visit: Payer: Self-pay | Admitting: *Deleted

## 2022-04-11 ENCOUNTER — Other Ambulatory Visit: Payer: Self-pay | Admitting: Physician Assistant

## 2022-04-11 MED ORDER — FLUTICASONE PROPIONATE HFA 44 MCG/ACT IN AERO
1.0000 | INHALATION_SPRAY | Freq: Two times a day (BID) | RESPIRATORY_TRACT | 12 refills | Status: DC
Start: 2022-04-11 — End: 2022-09-07

## 2022-04-11 NOTE — Telephone Encounter (Signed)
See other message

## 2022-04-11 NOTE — Telephone Encounter (Signed)
Spoke to pt asked her if she knows what inhaler her insurance will cover Pulmicort or Flovent? Pt said it will cover Flovent the only reason they gave her Pulmicort is because they did not have it in stock. Pt said she prefers the Flovent seems to work better. Told her okay I will send Rx over for Flovent. Pt verbalized understanding. Rx sent.

## 2022-04-19 ENCOUNTER — Telehealth: Payer: Self-pay

## 2022-04-19 NOTE — Telephone Encounter (Signed)
DOS 05/16/2022  METATARSAL OSTEOTOMY 2ND LT - 28308 HAMMERTOE REPAIR 2ND LT - 28285 HALLUX MPJ FUSION LT - 28750  [employer]  SPOKE TO [place] AT [employer]. NO PRECERT REQUIRED FOR CPT [id number], [id number] OR 91505.  CALL REF # [id number]

## 2022-04-23 ENCOUNTER — Encounter: Payer: Self-pay | Admitting: *Deleted

## 2022-04-24 ENCOUNTER — Encounter: Payer: Self-pay | Admitting: Gastroenterology

## 2022-04-24 ENCOUNTER — Ambulatory Visit (AMBULATORY_SURGERY_CENTER): Payer: BC Managed Care – PPO | Admitting: Gastroenterology

## 2022-04-24 VITALS — BP 109/51 | HR 78 | Temp 96.4°F | Resp 16 | Ht 61.0 in | Wt 153.0 lb

## 2022-04-24 DIAGNOSIS — Z1211 Encounter for screening for malignant neoplasm of colon: Secondary | ICD-10-CM | POA: Diagnosis not present

## 2022-04-24 DIAGNOSIS — K635 Polyp of colon: Secondary | ICD-10-CM | POA: Diagnosis not present

## 2022-04-24 DIAGNOSIS — R195 Other fecal abnormalities: Secondary | ICD-10-CM

## 2022-04-24 DIAGNOSIS — D122 Benign neoplasm of ascending colon: Secondary | ICD-10-CM

## 2022-04-24 MED ORDER — SODIUM CHLORIDE 0.9 % IV SOLN: 500.0000 mL | Freq: Once | INTRAVENOUS | Status: DC

## 2022-04-24 NOTE — Progress Notes (Signed)
To pacu, VSS. Report to [email or web address].tb 

## 2022-04-24 NOTE — Progress Notes (Signed)
Pt's states no medical or surgical changes since previsit or office visit. 

## 2022-04-24 NOTE — Progress Notes (Signed)
Referring Provider: Inda Coke, PA Primary Care Physician:  Inda Coke, PA   Indication for Colonoscopy:  + Cologuard   IMPRESSION:  + Cologuard Appropriate candidate for monitored anesthesia care  PLAN: EGD Colonoscopy in the [facility] today   HPI: Dana Savage is a 56 y.o. female presents for colonoscopy to evaluate a + Cologuard.  No prior colonoscopy.  No known family history of colon cancer or polyps. No family history of uterine/endometrial cancer, pancreatic cancer or gastric/stomach cancer.   Past Medical History:  Diagnosis Date   Childhood asthma     Past Surgical History:  Procedure Laterality Date   ABDOMINAL HYSTERECTOMY  05/28/2011   Procedure: HYSTERECTOMY ABDOMINAL;  Surgeon: Catha Brow;  Location: [facility];  Service: Gynecology;  Laterality: N/A;   CESAREAN SECTION  2001   KNEE ARTHROSCOPY     SALPINGOOPHORECTOMY  05/28/2011   Procedure: SALPINGO OOPHERECTOMY;  Surgeon: Catha Brow;  Location: [facility];  Service: Gynecology;  Laterality: Left;    Current Outpatient Medications  Medication Sig Dispense Refill   fluticasone (FLONASE) 50 MCG/ACT nasal spray Place 1 spray into both nostrils at bedtime.     Multiple Vitamin (MULTIVITAMIN) tablet Take 1 tablet by mouth daily.     albuterol (VENTOLIN HFA) 108 (90 Base) MCG/ACT inhaler INHALE 2 PUFFS INTO THE LUNGS EVERY 6 HOURS AS NEEDED FOR WHEEZING OR SHORTNESS OF BREATH. 8.5 each 2   fluticasone (FLOVENT HFA) 44 MCG/ACT inhaler Inhale 1 puff into the lungs in the morning and at bedtime. 1 each 12   gabapentin (NEURONTIN) 100 MG capsule Take one tablet ( 100 mg) by mouth one hour before bedtime every night. 30 capsule 1   ibuprofen (ADVIL) 200 MG tablet Take 400 mg by mouth every 6 (six) hours as needed.     Current Facility-Administered Medications  Medication Dose Route Frequency Provider Last Rate Last Admin   0.9 %  sodium chloride infusion  500 mL Intravenous Once Thornton Park, MD         Allergies as of 04/24/2022 - Review Complete 04/24/2022  Allergen Reaction Noted   Aspirin Hives 05/15/2011   Codeine  05/15/2011   Latex Itching 05/22/2011    Family History  Problem Relation Age of Onset   Heart attack Mother    Prostate cancer Father    Prostate cancer Brother        prostatectomy   Parkinson's disease Paternal Uncle    Colon cancer Neg Hx    Colon polyps Neg Hx    Esophageal cancer Neg Hx    Stomach cancer Neg Hx    Rectal cancer Neg Hx      Physical Exam: General:   Alert,  well-nourished, pleasant and cooperative in NAD Head:  Normocephalic and atraumatic. Eyes:  Sclera clear, no icterus.   Conjunctiva pink. Mouth:  No deformity or lesions.   Neck:  Supple; no masses or thyromegaly. Lungs:  Clear throughout to auscultation.   No wheezes. Heart:  Regular rate and rhythm; no murmurs. Abdomen:  Soft, non-tender, nondistended, normal bowel sounds, no rebound or guarding.  Msk:  Symmetrical. No boney deformities LAD: No inguinal or umbilical LAD Extremities:  No clubbing or edema. Neurologic:  Alert and  oriented x4;  grossly nonfocal Skin:  No obvious rash or bruise. Psych:  Alert and cooperative. Normal mood and affect.     Studies/Results: No results found.    Jameria Bradway L. Tarri Glenn, MD, MPH 04/24/2022, 8:37 AM

## 2022-04-24 NOTE — Progress Notes (Signed)
Called to room to assist during endoscopic procedure.  Patient ID and intended procedure confirmed with present staff. Received instructions for my participation in the procedure from the performing physician.  

## 2022-04-24 NOTE — Patient Instructions (Addendum)
- Patient has a contact number available for emergencies. The signs and symptoms of potential delayed complications were discussed with the patient. Return to normal activities tomorrow. Written discharge instructions were provided to the patient.  - Resume previous diet  - Continue present medications.  - Await pathology results.  - Repeat colonoscopy date  - Repeat colonoscopy date to be determined after pending pathology results are reviewed for surveillance.  - Emerging evidence supports eating a diet of fruits, vegetables, grains, calcium, and yogurt while reducing red meat and alcohol may reduce the risk of colon cancer.  - Thank you for allowing me to be involved in your colon cancer prevention.  -handout on polyps and diverticulosis provided   YOU HAD AN ENDOSCOPIC PROCEDURE TODAY AT [facility]:   Refer to the procedure report that was given to you for any specific questions about what was found during the examination.  If the procedure report does not answer your questions, please call your gastroenterologist to clarify.  If you requested that your care partner not be given the details of your procedure findings, then the procedure report has been included in a sealed envelope for you to review at your convenience later.  YOU SHOULD EXPECT: Some feelings of bloating in the abdomen. Passage of more gas than usual.  Walking can help get rid of the air that was put into your GI tract during the procedure and reduce the bloating. If you had a lower endoscopy (such as a colonoscopy or flexible sigmoidoscopy) you may notice spotting of blood in your stool or on the toilet paper. If you underwent a bowel prep for your procedure, you may not have a normal bowel movement for a few days.  Please Note:  You might notice some irritation and congestion in your nose or some drainage.  This is from the oxygen used during your procedure.  There is no need for concern and it should  clear up in a day or so.  SYMPTOMS TO REPORT IMMEDIATELY:  Following lower endoscopy (colonoscopy or flexible sigmoidoscopy):  Excessive amounts of blood in the stool  Significant tenderness or worsening of abdominal pains  Swelling of the abdomen that is new, acute  Fever of 100F or higher   For urgent or emergent issues, a gastroenterologist can be reached at any hour by calling [phone]. Do not use MyChart messaging for urgent concerns.    DIET:  We do recommend a small meal at first, but then you may proceed to your regular diet.  Drink plenty of fluids but you should avoid alcoholic beverages for 24 hours.  ACTIVITY:  You should plan to take it easy for the rest of today and you should NOT DRIVE or use heavy machinery until tomorrow (because of the sedation medicines used during the test).    FOLLOW UP: Our staff will call the number listed on your records the next business day following your procedure.  We will call around 7:15- 8:00 am to check on you and address any questions or concerns that you may have regarding the information given to you following your procedure. If we do not reach you, we will leave a message.     If any biopsies were taken you will be contacted by phone or by letter within the next 1-3 weeks.  Please call us at [phone] if you have not heard about the biopsies in 3 weeks.    SIGNATURES/CONFIDENTIALITY: You and/or your care partner have signed  paperwork which will be entered into your electronic medical record.  These signatures attest to the fact that that the information above on your After Visit Summary has been reviewed and is understood.  Full responsibility of the confidentiality of this discharge information lies with you and/or your care-partner.

## 2022-04-24 NOTE — Op Note (Signed)
[facility] Patient Name: Dana Savage Procedure Date: 04/24/2022 9:04 AM MRN: [id number] Endoscopist: Thornton Park MD, MD Age: 56 Referring MD:  Date of Birth: 15-Dec-1965 Gender: Female Account #: [id number] Procedure:                Colonoscopy Indications:              Positive Cologuard test                           No prior colonoscopy                           No known family history of colon cancer or polyps Medicines:                Monitored Anesthesia Care Procedure:                Pre-Anesthesia Assessment:                           - Prior to the procedure, a History and Physical                            was performed, and patient medications and                            allergies were reviewed. The patient's tolerance of                            previous anesthesia was also reviewed. The risks                            and benefits of the procedure and the sedation                            options and risks were discussed with the patient.                            All questions were answered, and informed consent                            was obtained. Prior Anticoagulants: The patient has                            taken no previous anticoagulant or antiplatelet                            agents. ASA Grade Assessment: II - A patient with                            mild systemic disease. After reviewing the risks                            and benefits, the patient was deemed in  satisfactory condition to undergo the procedure.                           After obtaining informed consent, the colonoscope                            was passed under direct vision. Throughout the                            procedure, the patient's blood pressure, pulse, and                            oxygen saturations were monitored continuously. The                            Olympus PCF-H190DL (#[id number]) Colonoscope was                             introduced through the anus and advanced to the 3                            cm into the ileum. A second forawrd view of the                            right colon was performed. The colonoscopy was                            performed without difficulty. The patient tolerated                            the procedure well. The quality of the bowel                            preparation was good. The terminal ileum, ileocecal                            valve, appendiceal orifice, and rectum were                            photographed. Scope In: 9:14:05 AM Scope Out: 9:27:06 AM Scope Withdrawal Time: 0 hours 9 minutes 23 seconds  Total Procedure Duration: 0 hours 13 minutes 1 second  Findings:                 The perianal and digital rectal examinations were                            normal.                           Two sessile polyps were found in the ascending                            colon. The polyps were 6 to 8 mm in size. These  polyps were removed with a cold snare. Resection                            and retrieval were complete. Estimated blood loss                            was minimal.                           The exam was otherwise without abnormality on                            direct and retroflexion views. Complications:            No immediate complications. Estimated Blood Loss:     Estimated blood loss was minimal. Impression:               - Two 6 to 8 mm polyps in the ascending colon,                            removed with a cold snare. Resected and retrieved.                           - The examination was otherwise normal on direct                            and retroflexion views. Recommendation:           - Patient has a contact number available for                            emergencies. The signs and symptoms of potential                            delayed complications were discussed with the                             patient. Return to normal activities tomorrow.                            Written discharge instructions were provided to the                            patient.                           - Resume previous diet.                           - Continue present medications.                           - Await pathology results.                           - Repeat colonoscopy date to be determined after  pending pathology results are reviewed for                            surveillance.                           - Emerging evidence supports eating a diet of                            fruits, vegetables, grains, calcium, and yogurt                            while reducing red meat and alcohol may reduce the                            risk of colon cancer.                           - Thank you for allowing me to be involved in your                            colon cancer prevention. Thornton Park MD, MD 04/24/2022 9:33:17 AM This report has been signed electronically.

## 2022-04-25 ENCOUNTER — Telehealth: Payer: Self-pay | Admitting: *Deleted

## 2022-04-25 NOTE — Telephone Encounter (Signed)
  Follow up Call-     04/24/2022    8:26 AM  Call back number  Post procedure Call Back phone  # [phone]  Permission to leave phone message Yes     Patient questions:  Do you have a fever, pain , or abdominal swelling? No. Pain Score  0 *  Have you tolerated food without any problems? Yes.    Have you been able to return to your normal activities? Yes.    Do you have any questions about your discharge instructions: Diet   No. Medications  No. Follow up visit  No.  Do you have questions or concerns about your Care? No.  Actions: * If pain score is 4 or above: No action needed, pain <4.

## 2022-04-27 ENCOUNTER — Encounter: Payer: Self-pay | Admitting: Gastroenterology

## 2022-04-29 ENCOUNTER — Encounter: Payer: Self-pay | Admitting: Physician Assistant

## 2022-05-02 ENCOUNTER — Telehealth: Payer: Self-pay

## 2022-05-02 NOTE — Telephone Encounter (Addendum)
Dana Savage called to reschedule her surgery with Dr. Jacqualyn Posey (Dr. Paulla Dolly assist) on 05/16/2022. She stated she has some routine dental work done earlier this week and then did something to her jaw. She can only open it 1 inch. They have her on steroids and in PT. I have her rescheduled to 05/30/2022.Notified Dr. Jacqualyn Posey, Dr. Paulla Dolly and Caren Griffins with [facility]

## 2022-05-14 ENCOUNTER — Other Ambulatory Visit: Payer: Self-pay | Admitting: General Practice

## 2022-05-14 DIAGNOSIS — M26639 Articular disc disorder of temporomandibular joint, unspecified side: Secondary | ICD-10-CM

## 2022-05-21 ENCOUNTER — Encounter: Payer: BC Managed Care – PPO | Admitting: Podiatry

## 2022-05-30 ENCOUNTER — Other Ambulatory Visit: Payer: Self-pay | Admitting: Podiatry

## 2022-05-30 DIAGNOSIS — M25775 Osteophyte, left foot: Secondary | ICD-10-CM

## 2022-05-30 DIAGNOSIS — M19072 Primary osteoarthritis, left ankle and foot: Secondary | ICD-10-CM | POA: Diagnosis not present

## 2022-05-30 DIAGNOSIS — M21542 Acquired clubfoot, left foot: Secondary | ICD-10-CM

## 2022-05-30 DIAGNOSIS — G8918 Other acute postprocedural pain: Secondary | ICD-10-CM | POA: Diagnosis not present

## 2022-05-30 DIAGNOSIS — M21612 Bunion of left foot: Secondary | ICD-10-CM | POA: Diagnosis not present

## 2022-05-30 DIAGNOSIS — M2042 Other hammer toe(s) (acquired), left foot: Secondary | ICD-10-CM

## 2022-05-30 DIAGNOSIS — M84872 Other disorders of continuity of bone, left ankle and foot: Secondary | ICD-10-CM | POA: Diagnosis not present

## 2022-05-30 DIAGNOSIS — M205X2 Other deformities of toe(s) (acquired), left foot: Secondary | ICD-10-CM | POA: Diagnosis not present

## 2022-05-30 DIAGNOSIS — M2022 Hallux rigidus, left foot: Secondary | ICD-10-CM

## 2022-05-30 MED ORDER — TRAMADOL HCL 50 MG PO TABS: 50.0000 mg | ORAL_TABLET | Freq: Four times a day (QID) | ORAL | 0 refills | Status: AC | PRN

## 2022-05-30 MED ORDER — GABAPENTIN 100 MG PO CAPS: 100.0000 mg | ORAL_CAPSULE | Freq: Three times a day (TID) | ORAL | 0 refills | Status: DC

## 2022-05-30 MED ORDER — CEPHALEXIN 500 MG PO CAPS: 500.0000 mg | ORAL_CAPSULE | Freq: Three times a day (TID) | ORAL | 0 refills | Status: DC

## 2022-05-30 MED ORDER — IBUPROFEN 800 MG PO TABS: 800.0000 mg | ORAL_TABLET | Freq: Three times a day (TID) | ORAL | 0 refills | Status: DC | PRN

## 2022-05-30 MED ORDER — PROMETHAZINE HCL 25 MG PO TABS: 25.0000 mg | ORAL_TABLET | Freq: Three times a day (TID) | ORAL | 0 refills | Status: DC | PRN

## 2022-05-30 NOTE — Progress Notes (Signed)
Postop medications sent 

## 2022-05-31 ENCOUNTER — Encounter: Payer: BC Managed Care – PPO | Admitting: Podiatry

## 2022-06-04 ENCOUNTER — Ambulatory Visit (INDEPENDENT_AMBULATORY_CARE_PROVIDER_SITE_OTHER): Payer: BC Managed Care – PPO | Admitting: Podiatry

## 2022-06-04 ENCOUNTER — Telehealth: Payer: Self-pay | Admitting: *Deleted

## 2022-06-04 ENCOUNTER — Encounter: Payer: BC Managed Care – PPO | Admitting: Podiatry

## 2022-06-04 DIAGNOSIS — M21619 Bunion of unspecified foot: Secondary | ICD-10-CM

## 2022-06-04 DIAGNOSIS — M7752 Other enthesopathy of left foot: Secondary | ICD-10-CM

## 2022-06-04 DIAGNOSIS — Z9889 Other specified postprocedural states: Secondary | ICD-10-CM

## 2022-06-04 NOTE — Telephone Encounter (Signed)
Patient is calling to ask if she can apply heat to her back , have injured it after elevating her surgery foot,maybe pulled a muscle.. Please advise.

## 2022-06-09 ENCOUNTER — Other Ambulatory Visit: Payer: BC Managed Care – PPO

## 2022-06-11 NOTE — Progress Notes (Signed)
Subjective: Chief Complaint  Patient presents with   Routine Post Op    POV #1 DOS 05/30/2022 METATARASAL OSTEOTOMY 2ND LT, HAMMERTOE 2ND LT, HALLUX MPJ FUSION LT    Dana Savage is a 56 y.o. is seen today in office s/p first MPJ arthrodesis, second metatarsal osteotomy with hammertoe repair and exostectomy dorsal midfoot preformed on 05/30/2022.  She states she is doing well and her pain has not been severe.  She has been in the cam boot nonweightbearing.  Denies any systemic complaints such as fevers, chills, nausea, vomiting. No calf pain, chest pain, shortness of breath.   Objective: General: No acute distress, AAOx3  DP/PT pulses palpable 2/4, CRT < 3 sec to all digits.  Protective sensation intact. Motor function intact.  Left foot: Incision is well coapted without any evidence of dehiscence with sutures intact. There is no surrounding erythema, ascending cellulitis, fluctuance, crepitus, malodor, drainage/purulence. There is mild edema around the surgical site. There is mild pain along the surgical site.  No other areas of tenderness to bilateral lower extremities.  No other open lesions or pre-ulcerative lesions.  No pain with calf compression, swelling, warmth, erythema.   Assessment and Plan:  Status post left foot surgery, doing well with no complications   -Treatment options discussed including all alternatives, risks, and complications -X-rays were obtained reviewed.  Status post first metatarsal phalangeal joint arthrodesis, second metatarsal osteotomy and hammertoe repair and exostectomy.  Notes of acute fracture complicating factors noted. -Antibiotic ointment and dressing applied.  Keep incision clean, dry, intact -Continue nonweightbearing in cam boot -Ice/elevation -Pain medication as needed. -Monitor for any clinical signs or symptoms of infection and DVT/PE and directed to call the office immediately should any occur or go to the ER. -Follow-up as scheduled for  possible suture removal or sooner if any problems arise. In the meantime, encouraged to call the office with any questions, concerns, change in symptoms.   Celesta Gentile, DPM

## 2022-06-14 ENCOUNTER — Ambulatory Visit (INDEPENDENT_AMBULATORY_CARE_PROVIDER_SITE_OTHER): Payer: BC Managed Care – PPO | Admitting: Podiatry

## 2022-06-14 ENCOUNTER — Encounter: Payer: BC Managed Care – PPO | Admitting: Podiatry

## 2022-06-14 DIAGNOSIS — M898X9 Other specified disorders of bone, unspecified site: Secondary | ICD-10-CM

## 2022-06-14 DIAGNOSIS — Z9889 Other specified postprocedural states: Secondary | ICD-10-CM

## 2022-06-14 DIAGNOSIS — M21619 Bunion of unspecified foot: Secondary | ICD-10-CM

## 2022-06-14 DIAGNOSIS — M7752 Other enthesopathy of left foot: Secondary | ICD-10-CM

## 2022-06-16 NOTE — Progress Notes (Signed)
Subjective: Chief Complaint  Patient presents with   Routine Post Op    POV #2 DOS 05/30/2022 METATARASAL OSTEOTOMY 2ND LT, HAMMERTOE 2ND LT, HALLUX MPJ FUSION LT     Dana Savage is a 56 y.o. is seen today in office s/p first MPJ arthrodesis, second metatarsal osteotomy with hammertoe repair and exostectomy dorsal midfoot preformed on 05/30/2022.  She presents today for suture removal.  She states that she gets intermittent pain but she is not taking any medication for this.  She denies any fevers or chills.  She has no other concerns.   Objective: General: No acute distress, AAOx3  DP/PT pulses palpable 2/4, CRT < 3 sec to all digits.  Protective sensation intact. Motor function intact.  Left foot: Incision is well coapted without any evidence of dehiscence with sutures intact.  There is still some edema present.  There is no erythema or warmth but there is no drainage or pus.  K wire intact the second toe without any drainage.  Toes are in rectus position. No other open lesions or pre-ulcerative lesions.  No pain with calf compression, swelling, warmth, erythema.   Assessment and Plan:  Status post left foot surgery, doing well with no complications   -Treatment options discussed including all alternatives, risks, and complications -Sutures removed today without complications.  Antibiotic ointment and dressing applied. -Continue cam boot.  Discussed she can start to transition to partial weightbearing as tolerated.  Continue ice, elevate as well as compression help with any residual edema. -Pain medication as needed. -Monitor for any clinical signs or symptoms of infection and DVT/PE and directed to call the office immediately should any occur or go to the ER. -Follow-up as scheduled for repeat x-ray or sooner if any problems arise. In the meantime, encouraged to call the office with any questions, concerns, change in symptoms.   Celesta Gentile, DPM

## 2022-06-28 ENCOUNTER — Ambulatory Visit (INDEPENDENT_AMBULATORY_CARE_PROVIDER_SITE_OTHER): Payer: BC Managed Care – PPO | Admitting: Podiatry

## 2022-06-28 ENCOUNTER — Ambulatory Visit (INDEPENDENT_AMBULATORY_CARE_PROVIDER_SITE_OTHER): Payer: BC Managed Care – PPO

## 2022-06-28 DIAGNOSIS — M21619 Bunion of unspecified foot: Secondary | ICD-10-CM

## 2022-06-28 DIAGNOSIS — Z9889 Other specified postprocedural states: Secondary | ICD-10-CM | POA: Diagnosis not present

## 2022-06-28 DIAGNOSIS — M205X9 Other deformities of toe(s) (acquired), unspecified foot: Secondary | ICD-10-CM

## 2022-06-28 DIAGNOSIS — M898X9 Other specified disorders of bone, unspecified site: Secondary | ICD-10-CM

## 2022-06-28 NOTE — Progress Notes (Signed)
Subjective: Chief Complaint  Patient presents with   Routine Post Op    Patient came in today for POV #3 DOS 05/30/2022 METATARASAL OSTEOTOMY 2ND LT, HAMMERTOE 2ND LT, HALLUX MPJ FUSION LT, patient is still having some pain, Rate of pain 5 out of 10, swelling TX: cam boot, Knee scooter      Dana Savage is a 56 y.o. is seen today in office s/p first MPJ arthrodesis, second metatarsal osteotomy with hammertoe repair and exostectomy dorsal midfoot preformed on 05/30/2022.  She has some intermittent discomfort/pain on a continual basis.  She has been using a knee scooter.  No recent injuries or falls.  Denies any fevers or chills.  She has no other concerns.  Objective: General: No acute distress, AAOx3  DP/PT pulses palpable 2/4, CRT < 3 sec to all digits.  Protective sensation intact. Motor function intact.  Left foot: Incision is well coapted without any evidence of dehiscence with sutures intact.  There is still some edema present however the edema seems to be improving compared to last appointment.  There is no erythema or warmth but there is no drainage or pus.  K wire intact the second toe without any drainage.  Toes are in rectus position. No other open lesions or pre-ulcerative lesions.  No pain with calf compression, swelling, warmth, erythema.   Assessment and Plan:  Status post left foot surgery, doing well with no complications   -Treatment options discussed including all alternatives, risks, and complications -X-rays were obtained and reviewed.  3 views of the left foot were obtained.  No evidence of acute fracture.  Hardware intact status post first metatarsal phalangeal joint arthrodesis, second digit hammertoe repair. -We discussed she can transition to partial weightbearing but still in the cam boot.  Continue ice, elevate as well as compression to help with any residual swelling. -Pain medication as needed-using OTC -Monitor for any clinical signs or symptoms of infection and  DVT/PE and directed to call the office immediately should any occur or go to the ER. -Follow-up as scheduled for pin removal, repeat x-ray or sooner if any problems arise. In the meantime, encouraged to call the office with any questions, concerns, change in symptoms.   Celesta Gentile, DPM

## 2022-06-29 ENCOUNTER — Other Ambulatory Visit: Payer: Self-pay | Admitting: Physician Assistant

## 2022-07-12 ENCOUNTER — Encounter: Payer: Self-pay | Admitting: *Deleted

## 2022-07-12 ENCOUNTER — Telehealth: Payer: Self-pay

## 2022-07-12 NOTE — Telephone Encounter (Signed)
[facility] med request for PULMICORT 90 mcg. Not on patient's current med list. Please advise if script is appropriate

## 2022-07-13 ENCOUNTER — Ambulatory Visit (INDEPENDENT_AMBULATORY_CARE_PROVIDER_SITE_OTHER): Payer: BC Managed Care – PPO

## 2022-07-13 ENCOUNTER — Other Ambulatory Visit: Payer: Self-pay

## 2022-07-13 ENCOUNTER — Encounter: Payer: Self-pay | Admitting: Podiatry

## 2022-07-13 ENCOUNTER — Ambulatory Visit: Payer: BC Managed Care – PPO

## 2022-07-13 VITALS — BP 152/82

## 2022-07-13 DIAGNOSIS — M2042 Other hammer toe(s) (acquired), left foot: Secondary | ICD-10-CM | POA: Diagnosis not present

## 2022-07-13 DIAGNOSIS — Z9889 Other specified postprocedural states: Secondary | ICD-10-CM

## 2022-07-13 MED ORDER — PULMICORT FLEXHALER 90 MCG/ACT IN AEPB: 1.0000 | INHALATION_SPRAY | Freq: Two times a day (BID) | RESPIRATORY_TRACT | 3 refills | Status: DC

## 2022-07-13 NOTE — Progress Notes (Signed)
POV #1 DOS 05/30/2022 , Xray's were taken at this visit  Patient states her left foot has been more irritated lately with a burning sensation around the 2nd toe. Patient states her foot seems to be more irritated now than it has been since surgery.  She has been sleeping with her Cam boot at night and is not bearing weight on her left foot. Patient denies any swelling , pain or discharge.

## 2022-07-13 NOTE — Telephone Encounter (Signed)
Sent to pharmacy 

## 2022-07-31 ENCOUNTER — Ambulatory Visit (INDEPENDENT_AMBULATORY_CARE_PROVIDER_SITE_OTHER): Payer: BC Managed Care – PPO

## 2022-07-31 ENCOUNTER — Encounter: Payer: Self-pay | Admitting: Podiatry

## 2022-07-31 ENCOUNTER — Ambulatory Visit (INDEPENDENT_AMBULATORY_CARE_PROVIDER_SITE_OTHER): Payer: BC Managed Care – PPO | Admitting: Podiatry

## 2022-07-31 VITALS — BP 146/80 | HR 80

## 2022-07-31 DIAGNOSIS — M205X2 Other deformities of toe(s) (acquired), left foot: Secondary | ICD-10-CM

## 2022-07-31 DIAGNOSIS — Z9889 Other specified postprocedural states: Secondary | ICD-10-CM

## 2022-07-31 DIAGNOSIS — M2042 Other hammer toe(s) (acquired), left foot: Secondary | ICD-10-CM | POA: Diagnosis not present

## 2022-08-03 NOTE — Patient Instructions (Signed)

## 2022-08-03 NOTE — Progress Notes (Signed)
Subjective: Chief Complaint  Patient presents with   Routine Post Op    DOS 05/30/2022 METATARASAL OSTEOTOMY 2ND LT, HAMMERTOE 2ND LT, HALLUX MPJ FUSION LT   "Its doing really good"     Dana Savage is a 57 y.o. is seen today in office s/p first MPJ arthrodesis, second metatarsal osteotomy with hammertoe repair and exostectomy dorsal midfoot preformed on 05/30/2022.  States that she has been doing well and not had any significant pain.  Some discomfort.  Any fevers or chills.  Objective: General: No acute distress, AAOx3  DP/PT pulses palpable 2/4, CRT < 3 sec to all digits.  Protective sensation intact. Motor function intact.  Left foot: Incision is well coapted without any evidence of dehiscence..  There is still some mild edema present, but improved.  There is no erythema or warmth but there is no drainage or pus.   No other open lesions or pre-ulcerative lesions.  No pain with calf compression, swelling, warmth, erythema.   Assessment and Plan:  Status post left foot surgery, doing well with no complications   -Treatment options discussed including all alternatives, risks, and complications -X-rays were obtained and reviewed.  3 views of the left foot were obtained.  No evidence of acute fracture.  Hardware intact status post first metatarsal phalangeal joint arthrodesis, second digit hammertoe repair. -Discussed gradual transition to regular shoe as tolerated.  Continue ice, elevate as well as compression of any residual edema.  Dispensed a splint to help hold the second toe in rectus position.  Is slightly deviated in the transverse plane. -Discussed different range of motion, rehab exercises.  Return in about 4 weeks (around 08/28/2022).  Repeat x-ray.  Trula Slade DPM

## 2022-08-06 IMAGING — DX DG CHEST 2V
2 series · 2 of 2 positions shown · non-contrast
Comparison: None.

CLINICAL DATA: Chest pain on off since [REDACTED]

EXAM:
CHEST - 2 VIEW

[chest pa]
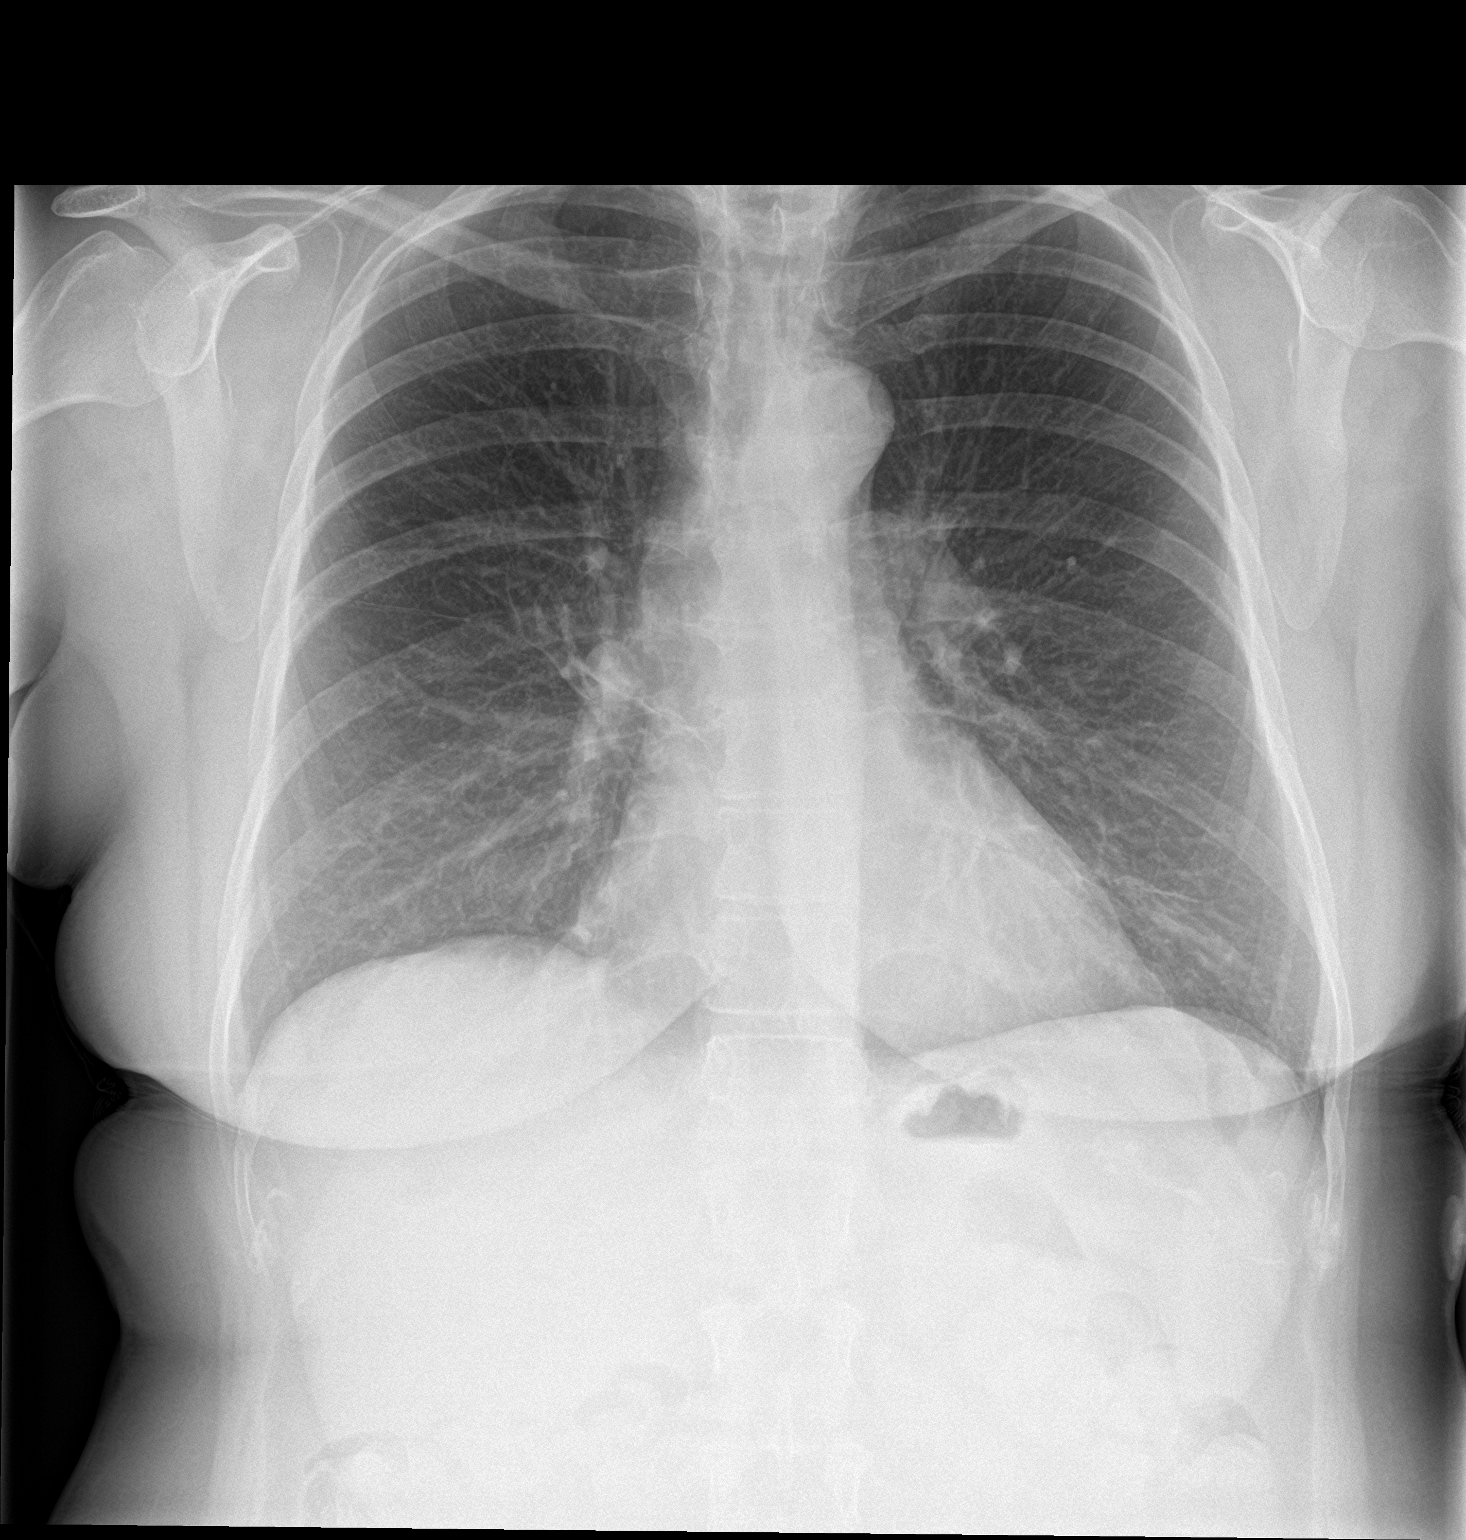

[chest lat]
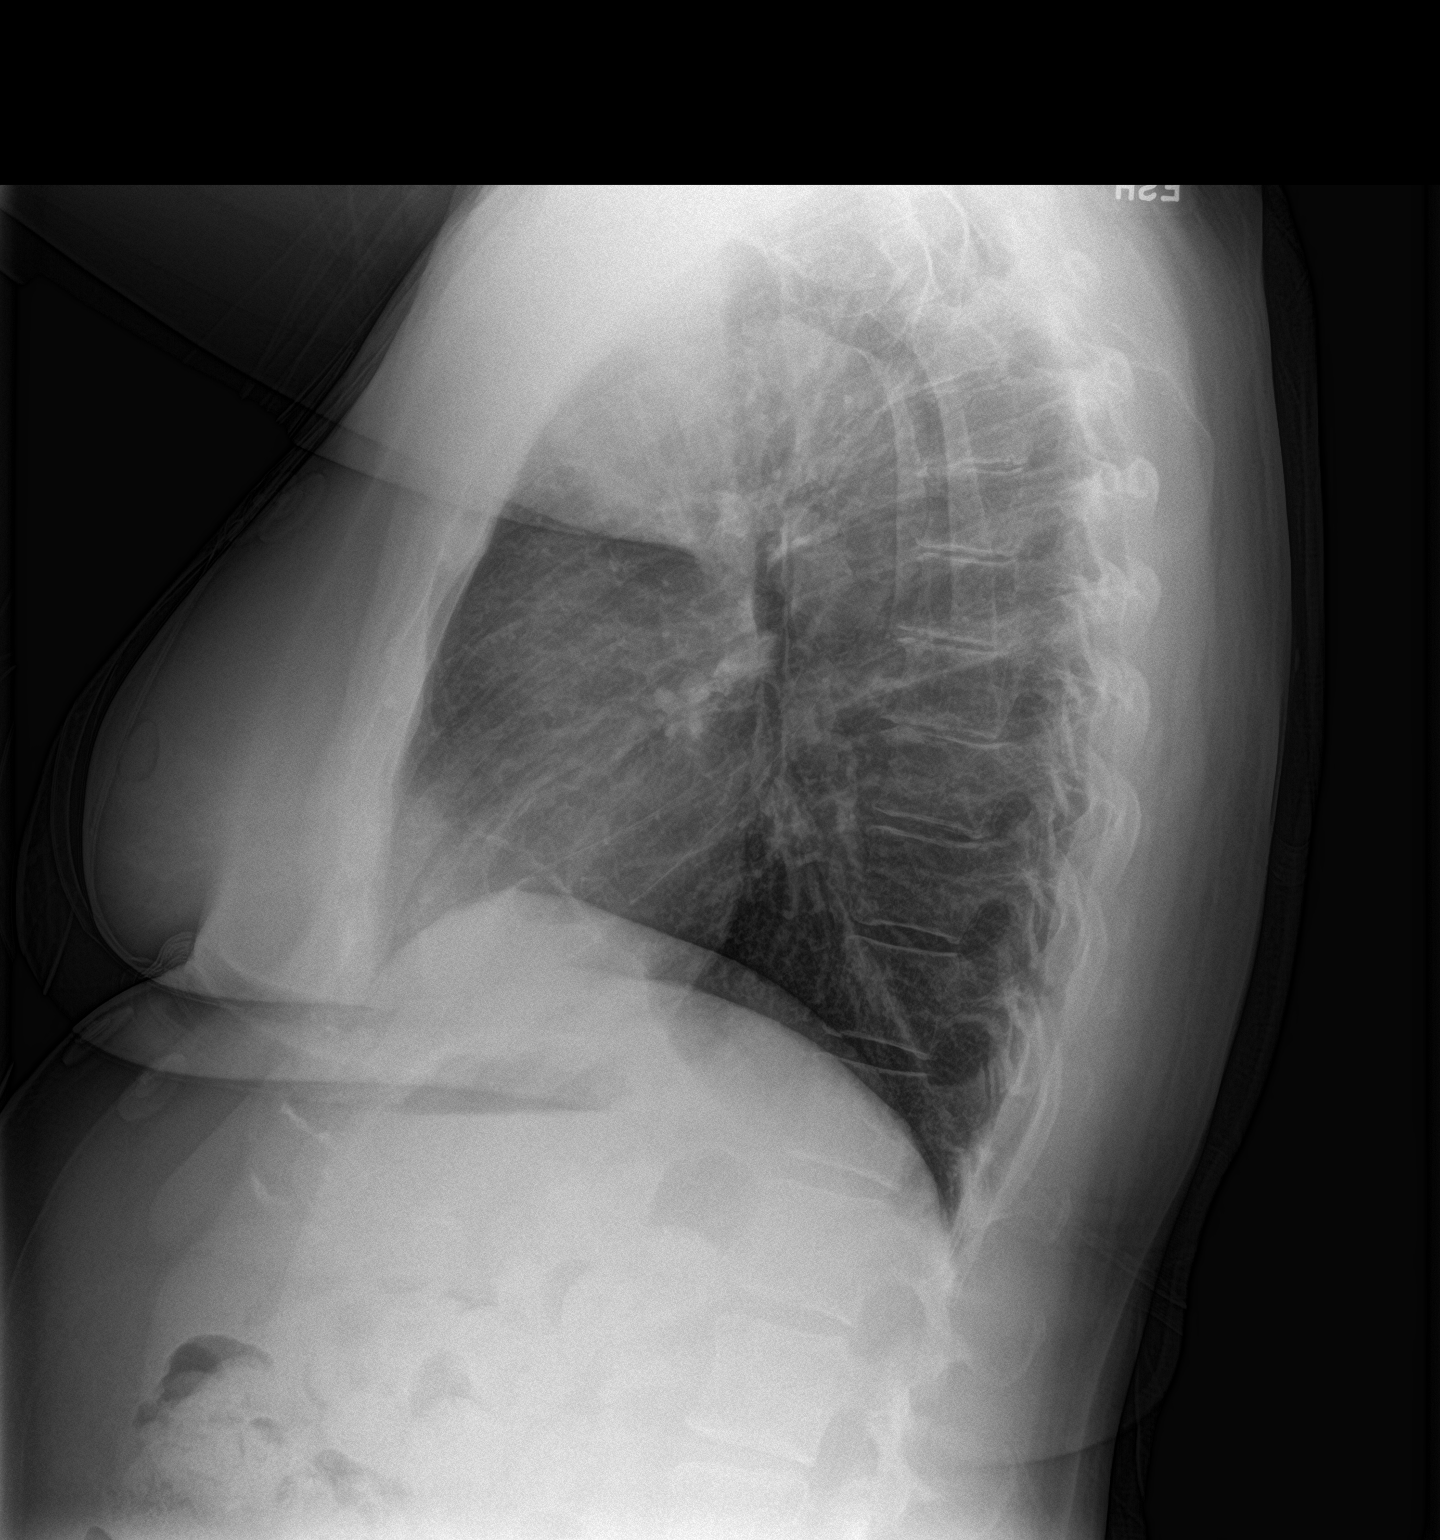

[2 of 2 positions shown; findings below may reference images not displayed]

FINDINGS: The heart size and mediastinal contours are within normal limits.
Both lungs are clear. The visualized skeletal structures are
unremarkable.
IMPRESSION: No active cardiopulmonary disease.

## 2022-08-28 ENCOUNTER — Ambulatory Visit: Payer: BC Managed Care – PPO | Admitting: Podiatry

## 2022-08-28 ENCOUNTER — Ambulatory Visit (INDEPENDENT_AMBULATORY_CARE_PROVIDER_SITE_OTHER): Payer: BC Managed Care – PPO

## 2022-08-28 DIAGNOSIS — Z9889 Other specified postprocedural states: Secondary | ICD-10-CM

## 2022-08-28 DIAGNOSIS — M205X2 Other deformities of toe(s) (acquired), left foot: Secondary | ICD-10-CM

## 2022-08-28 DIAGNOSIS — M2042 Other hammer toe(s) (acquired), left foot: Secondary | ICD-10-CM

## 2022-08-28 NOTE — Progress Notes (Signed)
Subjective: Chief Complaint  Patient presents with   Post-op Follow-up    Left foot, patient stated doing well     Dana Savage is a 57 y.o. is seen today in office s/p first MPJ arthrodesis, second metatarsal osteotomy with hammertoe repair and exostectomy dorsal midfoot preformed on 05/30/2022.  Says she has been doing well.  No significant pain.  No fevers or chills.  No other concerns.     Objective: General: No acute distress, AAOx3 - wearing a regular shoe DP/PT pulses palpable 2/4, CRT < 3 sec to all digits.  Protective sensation intact. Motor function intact.  Left foot: Incision is well coapted without any evidence of dehiscence.  There are small.  There is still some mild edema present, but this continues to improve.  There is no erythema or warmth but there is no drainage or pus.   No other open lesions or pre-ulcerative lesions.  No pain with calf compression, swelling, warmth, erythema.   Assessment and Plan:  Status post left foot surgery, doing well with no complications   -Treatment options discussed including all alternatives, risks, and complications -X-rays were obtained and reviewed.  3 views of the left foot were obtained.  No evidence of acute fracture.  Hardware intact status post first metatarsal phalangeal joint arthrodesis, second digit hammertoe repair. -She is back on regular shoe.  Discussed gradual increase in activity as tolerated.  Continue ice as well as compression. -PT ordered -Monitor for any clinical signs or symptoms of infection and directed to call the office immediately should any occur or go to the ER.  Return in about 6 weeks (around 10/09/2022).  X-ray next appointment  Trula Slade DPM

## 2022-09-03 DIAGNOSIS — R2689 Other abnormalities of gait and mobility: Secondary | ICD-10-CM | POA: Diagnosis not present

## 2022-09-03 DIAGNOSIS — M79672 Pain in left foot: Secondary | ICD-10-CM | POA: Diagnosis not present

## 2022-09-03 DIAGNOSIS — M79675 Pain in left toe(s): Secondary | ICD-10-CM | POA: Diagnosis not present

## 2022-09-03 DIAGNOSIS — M25675 Stiffness of left foot, not elsewhere classified: Secondary | ICD-10-CM | POA: Diagnosis not present

## 2022-09-07 ENCOUNTER — Encounter: Payer: Self-pay | Admitting: Physician Assistant

## 2022-09-07 ENCOUNTER — Ambulatory Visit: Payer: BC Managed Care – PPO | Admitting: Physician Assistant

## 2022-09-07 VITALS — BP 120/76 | HR 88 | Temp 97.7°F | Ht 61.0 in | Wt 162.0 lb

## 2022-09-07 DIAGNOSIS — M79675 Pain in left toe(s): Secondary | ICD-10-CM | POA: Diagnosis not present

## 2022-09-07 DIAGNOSIS — M25675 Stiffness of left foot, not elsewhere classified: Secondary | ICD-10-CM | POA: Diagnosis not present

## 2022-09-07 DIAGNOSIS — R2689 Other abnormalities of gait and mobility: Secondary | ICD-10-CM | POA: Diagnosis not present

## 2022-09-07 DIAGNOSIS — G4700 Insomnia, unspecified: Secondary | ICD-10-CM

## 2022-09-07 DIAGNOSIS — M79672 Pain in left foot: Secondary | ICD-10-CM | POA: Diagnosis not present

## 2022-09-07 DIAGNOSIS — R5382 Chronic fatigue, unspecified: Secondary | ICD-10-CM

## 2022-09-07 NOTE — Progress Notes (Signed)
Dana Savage is a 57 y.o. female here for a new problem.  History of Present Illness:   Chief Complaint  Patient presents with   Fatigue    Has worsened due to not sleeping.   Insomnia    Pt is having trouble sleeping taking 1-3 hours to fall asleep, then waking up every 2 hours, 1.5 weeks ago pt was waking up in a panic saying she needs to go to the doctor.    HPI  Fatigue and Insomnia  Patient is complaining of fatigue that has worsened from not sleeping.  Patient is complaining of insomnia, stating it takes her 1-3 hours to fall asleep. She then wakes up every 2 hours when she does sleep. Patient mentions that over the past 3 weeks, her sleep has progressively gotten worse. She has previously tried trazodone, but made her groggy. She has also tried 100 mg gabapentin which sometimes works and she is able to get 4 hours of sleep. Though other times, she becomes anxious and gets 2 hours of sleep.   She mentions that receently she is only getting about 2 hours of sleep at night. Patient still experiences some nervousness about missing important phone calls at night,but is willing to try gabapentin again. Patient also reports that 4 nights in a row, she has woken up in a panic almost saying "I need to go to the doctor" occurring about a week ago. She denies excess caffeine intake.     Past Medical History:  Diagnosis Date   Childhood asthma      Social History   Tobacco Use   Smoking status: Never   Smokeless tobacco: Never  Vaping Use   Vaping Use: Never used  Substance Use Topics   Alcohol use: Yes    Comment: occasional wine   Drug use: No    Past Surgical History:  Procedure Laterality Date   ABDOMINAL HYSTERECTOMY  05/28/2011   Procedure: HYSTERECTOMY ABDOMINAL;  Surgeon: Catha Brow;  Location: [facility];  Service: Gynecology;  Laterality: N/A;   CESAREAN SECTION  2001   KNEE ARTHROSCOPY     SALPINGOOPHORECTOMY  05/28/2011   Procedure: SALPINGO OOPHERECTOMY;   Surgeon: Catha Brow;  Location: [facility];  Service: Gynecology;  Laterality: Left;    Family History  Problem Relation Age of Onset   Heart attack Mother    Prostate cancer Father    Prostate cancer Brother        prostatectomy   Parkinson's disease Paternal Uncle    Colon cancer Neg Hx    Colon polyps Neg Hx    Esophageal cancer Neg Hx    Stomach cancer Neg Hx    Rectal cancer Neg Hx     Allergies  Allergen Reactions   Aspirin Hives    Pt tolerates ibuprofen Other reaction(s): Unknown   Codeine     Extreme vomiting Other reaction(s): Unknown   Latex Itching    Current Medications:   Current Outpatient Medications:    albuterol (VENTOLIN HFA) 108 (90 Base) MCG/ACT inhaler, INHALE 2 PUFFS INTO THE LUNGS EVERY 6 HOURS AS NEEDED FOR WHEEZING OR SHORTNESS OF BREATH., Disp: 8.5 each, Rfl: 2   Budesonide (PULMICORT FLEXHALER) 90 MCG/ACT inhaler, Inhale 1 puff into the lungs 2 (two) times daily., Disp: 1 each, Rfl: 3   fluticasone (FLONASE) 50 MCG/ACT nasal spray, Place 1 spray into both nostrils at bedtime., Disp: , Rfl:    gabapentin (NEURONTIN) 100 MG capsule, Take 1 capsule (100 mg  total) by mouth 3 (three) times daily. (Patient taking differently: Take 100 mg by mouth 3 (three) times daily. Has tried to take 100 mg at HS), Disp: 30 capsule, Rfl: 0   ibuprofen (ADVIL) 200 MG tablet, Take 400 mg by mouth every 6 (six) hours as needed., Disp: , Rfl:    Multiple Vitamin (MULTIVITAMIN) tablet, Take 1 tablet by mouth daily., Disp: , Rfl:    Review of Systems:   Review of Systems  Constitutional:  Positive for malaise/fatigue.  Psychiatric/Behavioral:  The patient has insomnia.     Vitals:   Vitals:   09/07/22 1502  BP: 120/76  Pulse: 88  Temp: 97.7 F (36.5 C)  TempSrc: Temporal  SpO2: 98%  Weight: 162 lb (73.5 kg)  Height: 5' 1"$  (1.549 m)     Body mass index is 30.61 kg/m.  Physical Exam:   Physical Exam Constitutional:      General: She is not in acute  distress.    Appearance: Normal appearance. She is not ill-appearing.  HENT:     Head: Normocephalic and atraumatic.     Right Ear: External ear normal.     Left Ear: External ear normal.  Eyes:     Extraocular Movements: Extraocular movements intact.     Pupils: Pupils are equal, round, and reactive to light.  Cardiovascular:     Rate and Rhythm: Normal rate and regular rhythm.     Heart sounds: Normal heart sounds. No murmur heard.    No gallop.  Pulmonary:     Effort: Pulmonary effort is normal. No respiratory distress.     Breath sounds: Normal breath sounds. No wheezing or rales.  Skin:    General: Skin is warm and dry.  Neurological:     Mental Status: She is alert and oriented to person, place, and time.  Psychiatric:        Judgment: Judgment normal.     Assessment and Plan:   Insomnia, unspecified type; Chronic fatigue Uncontrolled Update blood work today to assess for cause Restart nightly 100 mg gabapentin Increase by 100 mg nightly to goal of 300 mg nightly I have asked that she then let us know if she has a response to this  If lack of response or if unusual side effects, we will consider hydroxyzine May also consider referral to [facility] at [place] [facility] as a scribe for [employer], PA.,have documented all relevant documentation on the behalf of Inda Coke, PA,as directed by  Inda Coke, PA while in the presence of Inda Coke, [place].  I, Inda Coke, [place], have reviewed all documentation for this visit. The documentation on 09/07/22 for the exam, diagnosis, procedures, and orders are all accurate and complete.  Inda Coke, PA-C

## 2022-09-07 NOTE — Patient Instructions (Signed)
It was great to see you!  Start 100 mg gabapentin nightly Increase to 200 mg tomorrow Increase to 300 mg Sunday  I will be in touch on Monday with results  If no benefit with gabapentin, we will change to hydroxyzine and/or send to Sleep Medicine  Take care,  Inda Coke PA-C

## 2022-09-08 LAB — COMPREHENSIVE METABOLIC PANEL
AG Ratio: 1.4 (calc) (ref 1.0–2.5)
ALT: 16 U/L (ref 6–29)
AST: 18 U/L (ref 10–35)
Albumin: 4.3 g/dL (ref 3.6–5.1)
Alkaline phosphatase (APISO): 93 U/L (ref 37–153)
BUN: 18 mg/dL (ref 7–25)
CO2: 26 mmol/L (ref 20–32)
Calcium: 9.7 mg/dL (ref 8.6–10.4)
Chloride: 105 mmol/L (ref 98–110)
Creat: 0.7 mg/dL (ref 0.50–1.03)
Globulin: 3.1 g/dL (calc) (ref 1.9–3.7)
Glucose, Bld: 114 mg/dL — ABNORMAL HIGH (ref 65–99)
Potassium: 4.2 mmol/L (ref 3.5–5.3)
Sodium: 141 mmol/L (ref 135–146)
Total Bilirubin: 0.3 mg/dL (ref 0.2–1.2)
Total Protein: 7.4 g/dL (ref 6.1–8.1)

## 2022-09-08 LAB — CBC WITH DIFFERENTIAL/PLATELET
Absolute Monocytes: 475 cells/uL (ref 200–950)
Basophils Absolute: 29 cells/uL (ref 0–200)
Basophils Relative: 0.4 %
Eosinophils Absolute: 124 cells/uL (ref 15–500)
Eosinophils Relative: 1.7 %
HCT: 36.4 % (ref 35.0–45.0)
Hemoglobin: 12.3 g/dL (ref 11.7–15.5)
Lymphs Abs: 1905 cells/uL (ref 850–3900)
MCH: 27.2 pg (ref 27.0–33.0)
MCHC: 33.8 g/dL (ref 32.0–36.0)
MCV: 80.5 fL (ref 80.0–100.0)
MPV: 9.9 fL (ref 7.5–12.5)
Monocytes Relative: 6.5 %
Neutro Abs: 4767 cells/uL (ref 1500–7800)
Neutrophils Relative %: 65.3 %
Platelets: 328 10*3/uL (ref 140–400)
RBC: 4.52 10*6/uL (ref 3.80–5.10)
RDW: 12.9 % (ref 11.0–15.0)
Total Lymphocyte: 26.1 %
WBC: 7.3 10*3/uL (ref 3.8–10.8)

## 2022-09-08 LAB — VITAMIN B12: Vitamin B-12: 720 pg/mL (ref 200–1100)

## 2022-09-08 LAB — TSH: TSH: 1.28 mIU/L (ref 0.40–4.50)

## 2022-09-08 LAB — IRON,TIBC AND FERRITIN PANEL
%SAT: 12 % (calc) — ABNORMAL LOW (ref 16–45)
Ferritin: 34 ng/mL (ref 16–232)
Iron: 38 ug/dL — ABNORMAL LOW (ref 45–160)
TIBC: 323 mcg/dL (calc) (ref 250–450)

## 2022-09-08 LAB — T4, FREE: Free T4: 1.1 ng/dL (ref 0.8–1.8)

## 2022-09-08 LAB — T3, FREE: T3, Free: 3.3 pg/mL (ref 2.3–4.2)

## 2022-09-08 LAB — VITAMIN D 25 HYDROXY (VIT D DEFICIENCY, FRACTURES): Vit D, 25-Hydroxy: 43 ng/mL (ref 30–100)

## 2022-09-10 DIAGNOSIS — M25675 Stiffness of left foot, not elsewhere classified: Secondary | ICD-10-CM | POA: Diagnosis not present

## 2022-09-10 DIAGNOSIS — M79672 Pain in left foot: Secondary | ICD-10-CM | POA: Diagnosis not present

## 2022-09-10 DIAGNOSIS — M79675 Pain in left toe(s): Secondary | ICD-10-CM | POA: Diagnosis not present

## 2022-09-10 DIAGNOSIS — R2689 Other abnormalities of gait and mobility: Secondary | ICD-10-CM | POA: Diagnosis not present

## 2022-09-17 DIAGNOSIS — R2689 Other abnormalities of gait and mobility: Secondary | ICD-10-CM | POA: Diagnosis not present

## 2022-09-17 DIAGNOSIS — M79675 Pain in left toe(s): Secondary | ICD-10-CM | POA: Diagnosis not present

## 2022-09-17 DIAGNOSIS — M79672 Pain in left foot: Secondary | ICD-10-CM | POA: Diagnosis not present

## 2022-09-17 DIAGNOSIS — M25675 Stiffness of left foot, not elsewhere classified: Secondary | ICD-10-CM | POA: Diagnosis not present

## 2022-09-19 DIAGNOSIS — M79675 Pain in left toe(s): Secondary | ICD-10-CM | POA: Diagnosis not present

## 2022-09-19 DIAGNOSIS — M25675 Stiffness of left foot, not elsewhere classified: Secondary | ICD-10-CM | POA: Diagnosis not present

## 2022-09-19 DIAGNOSIS — R2689 Other abnormalities of gait and mobility: Secondary | ICD-10-CM | POA: Diagnosis not present

## 2022-09-19 DIAGNOSIS — M79672 Pain in left foot: Secondary | ICD-10-CM | POA: Diagnosis not present

## 2022-09-24 ENCOUNTER — Other Ambulatory Visit: Payer: Self-pay | Admitting: Physician Assistant

## 2022-09-24 ENCOUNTER — Encounter: Payer: Self-pay | Admitting: Physician Assistant

## 2022-09-24 DIAGNOSIS — R2689 Other abnormalities of gait and mobility: Secondary | ICD-10-CM | POA: Diagnosis not present

## 2022-09-24 DIAGNOSIS — M79675 Pain in left toe(s): Secondary | ICD-10-CM | POA: Diagnosis not present

## 2022-09-24 DIAGNOSIS — M79672 Pain in left foot: Secondary | ICD-10-CM | POA: Diagnosis not present

## 2022-09-24 DIAGNOSIS — M25675 Stiffness of left foot, not elsewhere classified: Secondary | ICD-10-CM | POA: Diagnosis not present

## 2022-09-24 MED ORDER — GABAPENTIN 300 MG PO CAPS: 300.0000 mg | ORAL_CAPSULE | Freq: Every day | ORAL | 1 refills | Status: DC

## 2022-10-01 DIAGNOSIS — R2689 Other abnormalities of gait and mobility: Secondary | ICD-10-CM | POA: Diagnosis not present

## 2022-10-01 DIAGNOSIS — M79675 Pain in left toe(s): Secondary | ICD-10-CM | POA: Diagnosis not present

## 2022-10-01 DIAGNOSIS — M79672 Pain in left foot: Secondary | ICD-10-CM | POA: Diagnosis not present

## 2022-10-01 DIAGNOSIS — M25675 Stiffness of left foot, not elsewhere classified: Secondary | ICD-10-CM | POA: Diagnosis not present

## 2022-10-08 DIAGNOSIS — R2689 Other abnormalities of gait and mobility: Secondary | ICD-10-CM | POA: Diagnosis not present

## 2022-10-08 DIAGNOSIS — M25675 Stiffness of left foot, not elsewhere classified: Secondary | ICD-10-CM | POA: Diagnosis not present

## 2022-10-08 DIAGNOSIS — M79675 Pain in left toe(s): Secondary | ICD-10-CM | POA: Diagnosis not present

## 2022-10-08 DIAGNOSIS — M79672 Pain in left foot: Secondary | ICD-10-CM | POA: Diagnosis not present

## 2022-10-09 ENCOUNTER — Ambulatory Visit: Payer: BC Managed Care – PPO | Admitting: Podiatry

## 2022-10-12 ENCOUNTER — Telehealth: Payer: Self-pay | Admitting: Physician Assistant

## 2022-10-12 NOTE — Telephone Encounter (Signed)
Pt states she is going to [place] and would like a call back for some advise on what to take so she does not get sick. Please advise.

## 2022-10-15 NOTE — Telephone Encounter (Signed)
LVM to schedule an ov 

## 2022-10-15 NOTE — Telephone Encounter (Signed)
Please call patient and schedule an appointment with Aldona Bar to discuss travel medications.

## 2022-10-19 DIAGNOSIS — M79675 Pain in left toe(s): Secondary | ICD-10-CM | POA: Diagnosis not present

## 2022-10-19 DIAGNOSIS — M79672 Pain in left foot: Secondary | ICD-10-CM | POA: Diagnosis not present

## 2022-10-19 DIAGNOSIS — R2689 Other abnormalities of gait and mobility: Secondary | ICD-10-CM | POA: Diagnosis not present

## 2022-10-19 DIAGNOSIS — M25675 Stiffness of left foot, not elsewhere classified: Secondary | ICD-10-CM | POA: Diagnosis not present

## 2022-10-21 ENCOUNTER — Encounter: Payer: Self-pay | Admitting: Physician Assistant

## 2022-10-22 ENCOUNTER — Ambulatory Visit: Payer: BC Managed Care – PPO | Admitting: Podiatry

## 2022-10-22 ENCOUNTER — Ambulatory Visit: Payer: BC Managed Care – PPO

## 2022-10-22 DIAGNOSIS — M21619 Bunion of unspecified foot: Secondary | ICD-10-CM | POA: Diagnosis not present

## 2022-10-22 DIAGNOSIS — M2042 Other hammer toe(s) (acquired), left foot: Secondary | ICD-10-CM

## 2022-10-22 DIAGNOSIS — M205X2 Other deformities of toe(s) (acquired), left foot: Secondary | ICD-10-CM | POA: Diagnosis not present

## 2022-10-22 DIAGNOSIS — Z9889 Other specified postprocedural states: Secondary | ICD-10-CM | POA: Diagnosis not present

## 2022-10-22 DIAGNOSIS — M2041 Other hammer toe(s) (acquired), right foot: Secondary | ICD-10-CM | POA: Diagnosis not present

## 2022-10-22 NOTE — Progress Notes (Unsigned)
Subjective: Chief Complaint  Patient presents with   Post-op Follow-up    DOS 05/30/2022 METATARASAL OSTEOTOMY 2ND LT, HAMMERTOE 2ND LT, HALLUX MPJ FUSION LT, PATIENT WOULD LIKE TO HAVE SX CONSULT ON RIGHT FOOT AS WELL     Dana Savage is a 57 y.o. is seen today in office s/p first MPJ arthrodesis, second metatarsal osteotomy with hammertoe repair and exostectomy dorsal midfoot preformed on 05/30/2022.  Overall she states that she is doing well.  Still with some tightness in the second toe that she points to but overall she feels that she is improving.  She had to consider having a right foot surgery done around August.     Objective: General: No acute distress, AAOx3 - wearing a regular shoe DP/PT pulses palpable 2/4, CRT < 3 sec to all digits.  Protective sensation intact. Motor function intact.  Left foot: Incision is well coapted without any evidence of dehiscence.  There is no significant edema there is no erythema or warmth.  Toes are rectus.  There is no tenderness to palpation on exam. Right foot: Decreased range of motion of first MPJ with bunion present.  Hammertoe present of the second digit. No pain with calf compression, swelling, warmth, erythema.   Assessment and Plan:  Status post left foot surgery, doing well with no complications   -Treatment options discussed including all alternatives, risks, and complications -X-rays were obtained and reviewed.  3 views of the left foot were obtained.  No evidence of acute fracture.  Hardware intact status post first metatarsal phalangeal joint arthrodesis, second digit hammertoe repair.  On the right foot arthritic changes present the first MPJ with elongated second metatarsal.  No evidence of acute fracture. -Left foot is doing well.  Continue with regular shoe gear and gradually increase activity level as tolerated.  Continue ice, elevate as well as compression to help with the residual edema.  Continue range of motion  exercises. -Will plan for right foot surgically first MPJ arthrodesis, second metatarsal osteotomy with hammertoe repair likely later this summer or early fall.  She will let me know when she wants to proceed with this.  Trula Slade DPM

## 2022-10-24 DIAGNOSIS — H43813 Vitreous degeneration, bilateral: Secondary | ICD-10-CM | POA: Diagnosis not present

## 2022-12-26 ENCOUNTER — Encounter: Payer: Self-pay | Admitting: Physician Assistant

## 2022-12-26 MED ORDER — ALBUTEROL SULFATE HFA 108 (90 BASE) MCG/ACT IN AERS: 2.0000 | INHALATION_SPRAY | Freq: Four times a day (QID) | RESPIRATORY_TRACT | 2 refills | Status: AC | PRN

## 2022-12-26 MED ORDER — PULMICORT FLEXHALER 90 MCG/ACT IN AEPB: 1.0000 | INHALATION_SPRAY | Freq: Two times a day (BID) | RESPIRATORY_TRACT | 2 refills | Status: AC

## 2023-01-21 DIAGNOSIS — F5101 Primary insomnia: Secondary | ICD-10-CM | POA: Diagnosis not present

## 2023-02-18 ENCOUNTER — Encounter: Payer: Self-pay | Admitting: Podiatry

## 2023-03-04 DIAGNOSIS — F5101 Primary insomnia: Secondary | ICD-10-CM | POA: Diagnosis not present

## 2023-03-14 ENCOUNTER — Ambulatory Visit: Payer: BC Managed Care – PPO | Admitting: Podiatry

## 2023-03-14 DIAGNOSIS — M2041 Other hammer toe(s) (acquired), right foot: Secondary | ICD-10-CM | POA: Diagnosis not present

## 2023-03-14 DIAGNOSIS — M2021 Hallux rigidus, right foot: Secondary | ICD-10-CM | POA: Diagnosis not present

## 2023-03-14 DIAGNOSIS — S90851S Superficial foreign body, right foot, sequela: Secondary | ICD-10-CM | POA: Diagnosis not present

## 2023-03-14 NOTE — Progress Notes (Signed)
Subjective: Chief Complaint  Patient presents with   Foot Pain    Pt would like to talk about surgery options    57 year old female presents the office today to discuss surgery on the right foot.  She send left foot is doing better.  She has minimal discomfort still some swelling but overall seems to be improving.  Wearing regular shoes and doing normal activities.  The patient discussed surgery in the right foot as well as the left foot improved and at this time she wants to consider surgery for the right foot.  Again she has tried shoe modifications, offloading without significant improvement.  She also states that on the right third toe she is a chronic discoloration as she had lead in the toe previously and she is not sure if they are still within the toe.  This was a remote injury that she has.  No synovitis or drainage that she reports.  No open lesions.  Objective: AAO x3, NAD DP/PT pulses palpable bilaterally, CRT less than 3 seconds Decreased range of motion of the first digit on the right foot and is crepitation with range of motion.  There is hammertoe contracture present at the second digit.  On the third digit there is some discoloration noted along the medial aspect of the toe from where she had bled in the toe previously.  I am not able to palpate a foreign body there is no edema, erythema or signs of infection. On the left foot there is some swelling present along the second MPJ there is no erythema or warmth there is grossly mild.  Has improved compared to prior appointments.  There is no significant pain on exam. No pain with calf compression, swelling, warmth, erythema  Assessment: Right foot hallux rigidus, hammertoe deformity with concern for foreign body right third toe  Plan: -All treatment options discussed with the patient including all alternatives, risks, complications.  -We discussed the conservative as well as surgical treatment options for the right foot.  She is  attempted conservative treatment she is doing well on the left foot she was to proceed with surgery on the right side.  Discussed with first medial arthrodesis, second metatarsal shortening osteotomy with hammertoe repair.  Given the symptom of the third toe we will do I&D of this area to see if there is any foreign object that needs to be removed and she would like to proceed.  -The incision placement as well as the postoperative course was discussed with the patient. I discussed risks of the surgery which include, but not limited to, infection, bleeding, pain, swelling, need for further surgery, delayed or nonhealing, painful or ugly scar, numbness or sensation changes, over/under correction, recurrence, transfer lesions, further deformity, hardware failure, DVT/PE, loss of toe/foot. Patient understands these risks and wishes to proceed with surgery. The surgical consent was reviewed with the patient all 3 pages were signed. No promises or guarantees were given to the outcome of the procedure. All questions were answered to the best of my ability. Before the surgery the patient was encouraged to call the office if there is any further questions. The surgery will be performed at the [facility] on an outpatient basis. -The left foot continue with supportive shoe gear.  Continue ice, compression to help with any residual edema. -Patient encouraged to call the office with any questions, concerns, change in symptoms.   Vivi Barrack DPM

## 2023-03-14 NOTE — Patient Instructions (Signed)

## 2023-03-18 ENCOUNTER — Ambulatory Visit: Payer: BC Managed Care – PPO | Admitting: Podiatry

## 2023-03-25 ENCOUNTER — Encounter: Payer: Self-pay | Admitting: Podiatry

## 2023-03-27 DIAGNOSIS — D229 Melanocytic nevi, unspecified: Secondary | ICD-10-CM | POA: Diagnosis not present

## 2023-03-27 DIAGNOSIS — L821 Other seborrheic keratosis: Secondary | ICD-10-CM | POA: Diagnosis not present

## 2023-03-27 DIAGNOSIS — L578 Other skin changes due to chronic exposure to nonionizing radiation: Secondary | ICD-10-CM | POA: Diagnosis not present

## 2023-03-27 DIAGNOSIS — L814 Other melanin hyperpigmentation: Secondary | ICD-10-CM | POA: Diagnosis not present

## 2023-04-11 ENCOUNTER — Telehealth: Payer: Self-pay | Admitting: Podiatry

## 2023-04-11 NOTE — Telephone Encounter (Signed)
DOS-05/08/2023  METATARSAL OSTEOTOMY 2ND RT-28308 REMOVAL FOREIGN BODY RT-10120 HAMMER TOA REPAIR 2ND RT-28285 HALLUX MPJ FUSION RT-28750 FUSION TENOTOMY 3RD 4TH RT-28010  [employer] EFFECTIVE DATE- 07/30/2018  DEDUCTIBLE - $1000.00 WITH REMAINING $672.02 OOP- $4500.00 WITH REMAINING $8657.84 COINSURANCE- 0%  SPOKE WITH JASON B. FROM [employer], HE STATED THAT FOR CPT CODES 28308,10120,28285,28750 AND 69629 NO PRIOR AUTHORIZATION IS REQUIRED.  CALL REF #: [id number]

## 2023-04-30 NOTE — Progress Notes (Signed)
Dana Savage is a 57 y.o. female and is here for a comprehensive physical exam.  HPI  Health Maintenance Due  Topic Date Due   Zoster Vaccines- Shingrix (1 of 2) Never done   INFLUENZA VACCINE  02/28/2023    Acute Concerns: ***  Chronic Issues: Mild Asthma: Managed with Budesonide inhaler and Albuterol inhaler.  Endorses intermittent tightness.   Insomnia: Previously managed with Melatonin (no noticeable effect), Trazadone (caused drowsiness), and Gabapentin (occasionally works). Mentioned previously that her anxiety over missing an important phone call prevents good sleep quality.  ***  Denies excessive caffeine intake.   Benign Familial Tremor: Complains of *** that began *** ago  ***  ***  ***    Health Maintenance: Immunizations -- *** Colonoscopy -- Endoscopy last done 04/24/22. 2 polyps removed, otherwise normal. Mammogram -- Last done 12/11/19. Results were normal. PAP -- N/A (due to hsyterectomy in 2012). Bone Density -- N/A.  Diet -- {CPE Diet/Exercise:30649} Exercise -- {CPE Diet/Exercise:30649}  Sleep habits -- {CPE Mood/Sleep:30650} Mood -- {CPE Mood/Sleep:30650}  UTD with dentist? - {Eye Doctor/Dentist:30651} UTD with eye doctor? - {Eye Doctor/Dentist:30651}  Weight history: Wt Readings from Last 10 Encounters:  09/07/22 162 lb (73.5 kg)  04/24/22 153 lb (69.4 kg)  03/26/22 153 lb (69.4 kg)  02/09/22 156 lb (70.8 kg)  08/18/21 158 lb 6.1 oz (71.8 kg)  07/18/21 160 lb 4 oz (72.7 kg)  09/05/20 161 lb (73 kg)  04/17/20 160 lb (72.6 kg)  11/10/18 162 lb (73.5 kg)  09/26/18 162 lb (73.5 kg)   There is no height or weight on file to calculate BMI. Patient's last menstrual period was 04/22/2011 (lmp unknown).  Alcohol use:  reports current alcohol use.  Tobacco use:  Tobacco Use: Low Risk  (09/07/2022)   Patient History    Smoking Tobacco Use: Never    Smokeless Tobacco Use: Never    Passive Exposure: Not on file   Eligible for lung  cancer screening? ***     09/07/2022    3:05 PM  Depression screen PHQ 2/9  Decreased Interest 1  Down, Depressed, Hopeless 1  PHQ - 2 Score 2  Altered sleeping 1  Tired, decreased energy 1  Change in appetite 1  Feeling bad or failure about yourself  1  Trouble concentrating 0  Moving slowly or fidgety/restless 0  Suicidal thoughts 0  PHQ-9 Score 6  Difficult doing work/chores Somewhat difficult     Other providers/specialists: Patient Care Team: Jarold Motto, [place] as PCP - General (Physician Assistant)    PMHx, SurgHx, SocialHx, Medications, and Allergies were reviewed in the Visit Navigator and updated as appropriate.   Past Medical History:  Diagnosis Date   Childhood asthma      Past Surgical History:  Procedure Laterality Date   ABDOMINAL HYSTERECTOMY  05/28/2011   Procedure: HYSTERECTOMY ABDOMINAL;  Surgeon: Jessee Avers;  Location: [facility];  Service: Gynecology;  Laterality: N/A;   CESAREAN SECTION  2001   KNEE ARTHROSCOPY     SALPINGOOPHORECTOMY  05/28/2011   Procedure: SALPINGO OOPHERECTOMY;  Surgeon: Jessee Avers;  Location: [facility];  Service: Gynecology;  Laterality: Left;     Family History  Problem Relation Age of Onset   Heart attack Mother    Prostate cancer Father    Prostate cancer Brother        prostatectomy   Parkinson's disease Paternal Uncle    Colon cancer Neg Hx    Colon polyps Neg Hx  Esophageal cancer Neg Hx    Stomach cancer Neg Hx    Rectal cancer Neg Hx     Social History   Tobacco Use   Smoking status: Never   Smokeless tobacco: Never  Vaping Use   Vaping status: Never Used  Substance Use Topics   Alcohol use: Yes    Comment: occasional wine   Drug use: No    Review of Systems:   ROS  Objective:   LMP 04/22/2011 (LMP Unknown)  There is no height or weight on file to calculate BMI.   General Appearance:    Alert, cooperative, no distress, appears stated age  Head:    Normocephalic, without obvious  abnormality, atraumatic  Eyes:    PERRL, conjunctiva/corneas clear, EOM's intact, fundi    benign, both eyes  Ears:    Normal TM's and external ear canals, both ears  Nose:   Nares normal, septum midline, mucosa normal, no drainage    or sinus tenderness  Throat:   Lips, mucosa, and tongue normal; teeth and gums normal  Neck:   Supple, symmetrical, trachea midline, no adenopathy;    thyroid:  no enlargement/tenderness/nodules; no carotid   bruit or JVD  Back:     Symmetric, no curvature, ROM normal, no CVA tenderness  Lungs:     Clear to auscultation bilaterally, respirations unlabored  Chest Wall:    No tenderness or deformity   Heart:    Regular rate and rhythm, S1 and S2 normal, no murmur, rub or gallop  Breast Exam:    ***No tenderness, masses, or nipple abnormality  Abdomen:     Soft, non-tender, bowel sounds active all four quadrants,    no masses, no organomegaly  Genitalia:    ***Normal female without lesion, discharge or tenderness  Extremities:   Extremities normal, atraumatic, no cyanosis or edema  Pulses:   2+ and symmetric all extremities  Skin:   Skin color, texture, turgor normal, no rashes or lesions  Lymph nodes:   Cervical, supraclavicular, and axillary nodes normal  Neurologic:   CNII-XII intact, normal strength, sensation and reflexes    throughout    Assessment/Plan:   There are no diagnoses linked to this encounter.   Jarold Motto, PA-C [facility]

## 2023-05-01 ENCOUNTER — Ambulatory Visit: Payer: BC Managed Care – PPO | Admitting: Physician Assistant

## 2023-05-01 ENCOUNTER — Encounter: Payer: Self-pay | Admitting: Physician Assistant

## 2023-05-01 VITALS — BP 110/80 | HR 83 | Temp 97.7°F | Ht 61.0 in | Wt 153.0 lb

## 2023-05-01 DIAGNOSIS — G4700 Insomnia, unspecified: Secondary | ICD-10-CM

## 2023-05-01 DIAGNOSIS — E663 Overweight: Secondary | ICD-10-CM | POA: Diagnosis not present

## 2023-05-01 DIAGNOSIS — Z0000 Encounter for general adult medical examination without abnormal findings: Secondary | ICD-10-CM | POA: Diagnosis not present

## 2023-05-01 DIAGNOSIS — J4530 Mild persistent asthma, uncomplicated: Secondary | ICD-10-CM

## 2023-05-01 DIAGNOSIS — D500 Iron deficiency anemia secondary to blood loss (chronic): Secondary | ICD-10-CM

## 2023-05-01 LAB — CBC WITH DIFFERENTIAL/PLATELET
Basophils Absolute: 0 10*3/uL (ref 0.0–0.1)
Basophils Relative: 0.4 % (ref 0.0–3.0)
Eosinophils Absolute: 0.1 10*3/uL (ref 0.0–0.7)
Eosinophils Relative: 1.5 % (ref 0.0–5.0)
HCT: 39.5 % (ref 36.0–46.0)
Hemoglobin: 12.9 g/dL (ref 12.0–15.0)
Lymphocytes Relative: 30.2 % (ref 12.0–46.0)
Lymphs Abs: 2.6 10*3/uL (ref 0.7–4.0)
MCHC: 32.5 g/dL (ref 30.0–36.0)
MCV: 83.7 fL (ref 78.0–100.0)
Monocytes Absolute: 0.4 10*3/uL (ref 0.1–1.0)
Monocytes Relative: 4.9 % (ref 3.0–12.0)
Neutro Abs: 5.5 10*3/uL (ref 1.4–7.7)
Neutrophils Relative %: 63 % (ref 43.0–77.0)
Platelets: 370 10*3/uL (ref 150.0–400.0)
RBC: 4.73 Mil/uL (ref 3.87–5.11)
RDW: 13.6 % (ref 11.5–15.5)
WBC: 8.8 10*3/uL (ref 4.0–10.5)

## 2023-05-01 LAB — COMPREHENSIVE METABOLIC PANEL
ALT: 23 U/L (ref 0–35)
AST: 23 U/L (ref 0–37)
Albumin: 4.4 g/dL (ref 3.5–5.2)
Alkaline Phosphatase: 90 U/L (ref 39–117)
BUN: 15 mg/dL (ref 6–23)
CO2: 27 meq/L (ref 19–32)
Calcium: 9.9 mg/dL (ref 8.4–10.5)
Chloride: 103 meq/L (ref 96–112)
Creatinine, Ser: 0.67 mg/dL (ref 0.40–1.20)
GFR: 96.64 mL/min (ref >60.00)
Glucose, Bld: 110 mg/dL — ABNORMAL HIGH (ref 70–99)
Potassium: 3.5 meq/L (ref 3.5–5.1)
Sodium: 138 meq/L (ref 135–145)
Total Bilirubin: 0.3 mg/dL (ref 0.2–1.2)
Total Protein: 8.2 g/dL (ref 6.0–8.3)

## 2023-05-01 LAB — LIPID PANEL
Cholesterol: 214 mg/dL — ABNORMAL HIGH (ref 0–200)
HDL: 65 mg/dL (ref >39.00)
LDL Cholesterol: 125 mg/dL — ABNORMAL HIGH (ref 0–99)
NonHDL: 149.12
Total CHOL/HDL Ratio: 3
Triglycerides: 121 mg/dL (ref 0.0–149.0)
VLDL: 24.2 mg/dL (ref 0.0–40.0)

## 2023-05-01 LAB — URINALYSIS, ROUTINE W REFLEX MICROSCOPIC
Bilirubin Urine: NEGATIVE
Hgb urine dipstick: NEGATIVE
Ketones, ur: NEGATIVE
Leukocytes,Ua: NEGATIVE
Nitrite: NEGATIVE
RBC / HPF: NONE SEEN (ref 0–?)
Specific Gravity, Urine: 1.025 (ref 1.000–1.030)
Total Protein, Urine: NEGATIVE
Urine Glucose: NEGATIVE
Urobilinogen, UA: 0.2 (ref 0.0–1.0)
pH: 6 (ref 5.0–8.0)

## 2023-05-01 LAB — VITAMIN B12: Vitamin B-12: 871 pg/mL (ref 211–911)

## 2023-05-01 LAB — IBC + FERRITIN
Ferritin: 61.1 ng/mL (ref 10.0–291.0)
Iron: 44 ug/dL (ref 42–145)
Saturation Ratios: 13.3 % — ABNORMAL LOW (ref 20.0–50.0)
TIBC: 331.8 ug/dL (ref 250.0–450.0)
Transferrin: 237 mg/dL (ref 212.0–360.0)

## 2023-05-01 NOTE — Patient Instructions (Addendum)
It was great to see you!  Please schedule a lab only visit so we can update your blood work and urine  Take care,  Lelon Mast

## 2023-05-08 ENCOUNTER — Other Ambulatory Visit: Payer: Self-pay | Admitting: Podiatry

## 2023-05-08 DIAGNOSIS — G8918 Other acute postprocedural pain: Secondary | ICD-10-CM | POA: Diagnosis not present

## 2023-05-08 DIAGNOSIS — Z1833 Retained wood fragments: Secondary | ICD-10-CM | POA: Diagnosis not present

## 2023-05-08 DIAGNOSIS — M2011 Hallux valgus (acquired), right foot: Secondary | ICD-10-CM | POA: Diagnosis not present

## 2023-05-08 DIAGNOSIS — M205X1 Other deformities of toe(s) (acquired), right foot: Secondary | ICD-10-CM | POA: Diagnosis not present

## 2023-05-08 DIAGNOSIS — M2041 Other hammer toe(s) (acquired), right foot: Secondary | ICD-10-CM | POA: Diagnosis not present

## 2023-05-08 DIAGNOSIS — M19071 Primary osteoarthritis, right ankle and foot: Secondary | ICD-10-CM | POA: Diagnosis not present

## 2023-05-08 DIAGNOSIS — S90451A Superficial foreign body, right great toe, initial encounter: Secondary | ICD-10-CM | POA: Diagnosis not present

## 2023-05-08 DIAGNOSIS — M795 Residual foreign body in soft tissue: Secondary | ICD-10-CM | POA: Diagnosis not present

## 2023-05-08 DIAGNOSIS — M2021 Hallux rigidus, right foot: Secondary | ICD-10-CM | POA: Diagnosis not present

## 2023-05-08 DIAGNOSIS — M21541 Acquired clubfoot, right foot: Secondary | ICD-10-CM | POA: Diagnosis not present

## 2023-05-08 MED ORDER — IBUPROFEN 800 MG PO TABS: 800.0000 mg | ORAL_TABLET | Freq: Three times a day (TID) | ORAL | 0 refills | Status: AC | PRN

## 2023-05-08 MED ORDER — CEPHALEXIN 500 MG PO CAPS: 500.0000 mg | ORAL_CAPSULE | Freq: Three times a day (TID) | ORAL | 0 refills | Status: AC

## 2023-05-08 MED ORDER — GABAPENTIN 100 MG PO CAPS: 100.0000 mg | ORAL_CAPSULE | Freq: Three times a day (TID) | ORAL | 0 refills | Status: AC

## 2023-05-08 MED ORDER — TRAMADOL HCL 50 MG PO TABS: 50.0000 mg | ORAL_TABLET | Freq: Four times a day (QID) | ORAL | 0 refills | Status: AC | PRN

## 2023-05-08 NOTE — Progress Notes (Signed)
Post-op medication sent 

## 2023-05-10 ENCOUNTER — Other Ambulatory Visit: Payer: Self-pay | Admitting: Podiatry

## 2023-05-10 ENCOUNTER — Encounter: Payer: Self-pay | Admitting: Podiatry

## 2023-05-10 MED ORDER — MELOXICAM 15 MG PO TABS: 15.0000 mg | ORAL_TABLET | Freq: Every day | ORAL | 1 refills | Status: AC

## 2023-05-10 NOTE — Progress Notes (Signed)
Patient messaged in that the tramadol is causing nausea and dizziness.  Notes ibuprofen is not helping her pain.  No mention of the gabapentin.  Advised patient to d/c ibuprofen and Rx meloxicam 15mg  every day sent in for her to take & reviewed post-op instructions of rest, ice, elevate, and stay off of foot as much as possible.

## 2023-05-13 ENCOUNTER — Ambulatory Visit (INDEPENDENT_AMBULATORY_CARE_PROVIDER_SITE_OTHER): Payer: BC Managed Care – PPO

## 2023-05-13 ENCOUNTER — Encounter: Payer: Self-pay | Admitting: Podiatry

## 2023-05-13 ENCOUNTER — Ambulatory Visit (INDEPENDENT_AMBULATORY_CARE_PROVIDER_SITE_OTHER): Payer: BC Managed Care – PPO | Admitting: Podiatry

## 2023-05-13 DIAGNOSIS — M21611 Bunion of right foot: Secondary | ICD-10-CM | POA: Diagnosis not present

## 2023-05-13 DIAGNOSIS — M21619 Bunion of unspecified foot: Secondary | ICD-10-CM

## 2023-05-13 DIAGNOSIS — S90851S Superficial foreign body, right foot, sequela: Secondary | ICD-10-CM

## 2023-05-13 DIAGNOSIS — M2041 Other hammer toe(s) (acquired), right foot: Secondary | ICD-10-CM

## 2023-05-19 NOTE — Progress Notes (Signed)
Subjective: Chief Complaint  Patient presents with   Routine Post Op    PATIENT STATES IT HAS NOT BEEN GOOD, SHE IS ALLERGIC TO A LOT OF THE PAIN MEDICINES, SHE CAN HAVE IBUPROFEN.     57 year old female presents the office today for postop visit #1 status post first MPJ arthrodesis, hammertoe repair right second toe with removal of foreign body to the third toe as well as flexor tenotomy's of the left third and fourth toes.  She said that she had allergic reaction to the tramadol which she does not have before.  Pain is currently controlled.  No fevers or chills.  She been nonweightbearing of the right foot.  Objective: AAO x3, NAD DP/PT pulses palpable bilaterally, CRT less than 3 seconds Incision is well coapted with sutures intact.  There is minimal edema and some mild bruising noted but there is no erythema or warmth.  There is no drainage or pus.  No fluctuation or crepitation.  No signs of infection.  Toes are rectus. No pain with calf compression, swelling, warmth, erythema  Assessment: Status post bilateral foot surgery  Plan: -All treatment options discussed with the patient including all alternatives, risks, complications.  -X-rays obtained reviewed of the right foot.  3 views of the foot were obtained.  Status post first MTP arthrodesis with hammertoe repair and second metatarsal osteotomy without any evidence of acute fracture, acute fracture at this time. -Small amount of antibiotic ointment was applied followed by dressing.  She can keep dressing clean, dry, intact -Remain nonweightbearing cam boot -Ice/elevation -Pain medication as needed -Monitor for any clinical signs or symptoms of infection and directed to call the office immediately should any occur or go to the ER. -Patient encouraged to call the office with any questions, concerns, change in symptoms.   Vivi Barrack DPM

## 2023-05-23 ENCOUNTER — Ambulatory Visit (INDEPENDENT_AMBULATORY_CARE_PROVIDER_SITE_OTHER): Payer: BC Managed Care – PPO | Admitting: Podiatry

## 2023-05-23 ENCOUNTER — Encounter: Payer: Self-pay | Admitting: Podiatry

## 2023-05-23 DIAGNOSIS — M21619 Bunion of unspecified foot: Secondary | ICD-10-CM

## 2023-05-23 DIAGNOSIS — M2041 Other hammer toe(s) (acquired), right foot: Secondary | ICD-10-CM

## 2023-05-23 DIAGNOSIS — S90851S Superficial foreign body, right foot, sequela: Secondary | ICD-10-CM

## 2023-05-27 NOTE — Progress Notes (Signed)
Subjective: Chief Complaint  Patient presents with   Routine Post Op    PATIENT STATES THAT HER FOOT HAS BEEN SORE , PATIENT HAS BEEN TAKING MEDICATION FOR PAIN     57 year old female presents the office today for postop visit # 2 status post first MPJ arthrodesis, hammertoe repair right second toe with removal of foreign body to the third toe as well as flexor tenotomy's of the left third and fourth toes.  Today for possible suture removal.  Denies any fevers or chills.  Objective: AAO x3, NAD-niece is present DP/PT pulses palpable bilaterally, CRT less than 3 seconds Incision is well coapted with sutures intact.  There is minimal edema and some mild bruising noted but there is no erythema or warmth.  There is also improving.  There is still some edema noted but this is somewhat improved as well.  There is no drainage or pus.  No fluctuation or crepitation.  No signs of infection.  Toes are rectus. No pain with calf compression, swelling, warmth, erythema  Assessment: Status post bilateral foot surgery  Plan: -All treatment options discussed with the patient including all alternatives, risks, complications.  -I will stop the sutures today.  Small amount of antibiotic ointment was applied followed by dressing.  She gets dressing clean, dry, intact. -Remain nonweightbearing cam boot -Ice/elevation -Pain medication as needed -Monitor for any clinical signs or symptoms of infection and directed to call the office immediately should any occur or go to the ER. -Patient encouraged to call the office with any questions, concerns, change in symptoms.   Removal remainder sutures next appointment  Vivi Barrack DPM

## 2023-05-30 ENCOUNTER — Ambulatory Visit (INDEPENDENT_AMBULATORY_CARE_PROVIDER_SITE_OTHER): Payer: BC Managed Care – PPO | Admitting: Podiatry

## 2023-05-30 DIAGNOSIS — M2041 Other hammer toe(s) (acquired), right foot: Secondary | ICD-10-CM

## 2023-05-30 DIAGNOSIS — M2021 Hallux rigidus, right foot: Secondary | ICD-10-CM

## 2023-06-03 NOTE — Progress Notes (Signed)
Subjective: No chief complaint on file.    57 year old female presents the office today for suture removal status post first MPJ arthrodesis, hammertoe repair right second toe with removal of foreign body to the third toe as well as flexor tenotomy's of the left third and fourth toes.  Pain is controlled.  No fevers or chills.  Objective: AAO x3, NAD-niece is present DP/PT pulses palpable bilaterally, CRT less than 3 seconds Incision is well coapted with sutures intact on the second toe.  This is barely coapted.  There is no evidence of dehiscence at this time.  There is no drainage or pus or any erythema or signs of infection.  Toes are rectus.  No significant pain today. No pain with calf compression, swelling, warmth, erythema  Assessment: Status post bilateral foot surgery  Plan: -All treatment options discussed with the patient including all alternatives, risks, complications.  -Remove the remainder of the sutures today.  Incisions healing well.  A small amount of antibiotic ointment was applied followed by dressing.  Discussed she can wash with soap and water, dry thoroughly apply a similar bandage. -Remain nonweightbearing cam boot -Ice/elevation -Pain medication as needed -Monitor for any clinical signs or symptoms of infection and directed to call the office immediately should any occur or go to the ER. -Patient encouraged to call the office with any questions, concerns, change in symptoms.   X-ray next appointment  Vivi Barrack DPM

## 2023-06-05 DIAGNOSIS — R300 Dysuria: Secondary | ICD-10-CM | POA: Diagnosis not present

## 2023-06-05 DIAGNOSIS — N9089 Other specified noninflammatory disorders of vulva and perineum: Secondary | ICD-10-CM | POA: Diagnosis not present

## 2023-06-05 DIAGNOSIS — N898 Other specified noninflammatory disorders of vagina: Secondary | ICD-10-CM | POA: Diagnosis not present

## 2023-06-06 ENCOUNTER — Encounter: Payer: Self-pay | Admitting: Podiatry

## 2023-06-06 ENCOUNTER — Ambulatory Visit (INDEPENDENT_AMBULATORY_CARE_PROVIDER_SITE_OTHER): Payer: BC Managed Care – PPO | Admitting: Podiatry

## 2023-06-06 ENCOUNTER — Encounter: Payer: BC Managed Care – PPO | Admitting: Podiatry

## 2023-06-06 ENCOUNTER — Ambulatory Visit (INDEPENDENT_AMBULATORY_CARE_PROVIDER_SITE_OTHER): Payer: BC Managed Care – PPO

## 2023-06-06 DIAGNOSIS — M2021 Hallux rigidus, right foot: Secondary | ICD-10-CM

## 2023-06-06 DIAGNOSIS — Z9889 Other specified postprocedural states: Secondary | ICD-10-CM | POA: Diagnosis not present

## 2023-06-06 DIAGNOSIS — M2041 Other hammer toe(s) (acquired), right foot: Secondary | ICD-10-CM

## 2023-06-07 ENCOUNTER — Encounter: Payer: Self-pay | Admitting: Podiatry

## 2023-06-10 ENCOUNTER — Encounter: Payer: BC Managed Care – PPO | Admitting: Podiatry

## 2023-06-11 NOTE — Progress Notes (Signed)
Subjective: Chief Complaint  Patient presents with   Routine Post Op    POV#3 Patient states is good overall the incision is sensitive.     56 year old female presents the office today for suture removal status post first MPJ arthrodesis, hammertoe repair right second toe with removal of foreign body to the third toe as well as flexor tenotomy's of the left third and fourth toes.  Her pain is improving.  Denies any fevers or chills.  No recent injury changes otherwise.  She is still in the cam boot.  Objective: AAO x3, NAD DP/PT pulses palpable bilaterally, CRT less than 3 seconds Incision is well coapted and a scar is forming.  There is decreased edema present at surgical site.  There is still some tenderness palpation at surgical sites and K wires intact but any drainage or pus.  There is no significant signs of cellulitis today.  Toes are rectus. No other areas of discomfort.  No pain with calf compression, swelling, warmth, erythema  Assessment: Status post bilateral foot surgery  Plan: -All treatment options discussed with the patient including all alternatives, risks, complications.  -X-rays were obtained and reviewed.  Multiple views of the foot were obtained.  Hardware intact..  No evidence of acute fracture. -Discussed that she could be partial weightbearing to the heel for short distances.  Otherwise remain in the cam boot at all times.  Ice, elevate as well as compression open residual edema.  Pain medication as needed. -Monitor for any clinical signs or symptoms of infection and directed to call the office immediately should any occur or go to the ER. -Patient encouraged to call the office with any questions, concerns, change in symptoms.   X-ray next appointment; likely pin removal   Dana Savage DPM

## 2023-06-20 ENCOUNTER — Ambulatory Visit (INDEPENDENT_AMBULATORY_CARE_PROVIDER_SITE_OTHER): Payer: BC Managed Care – PPO

## 2023-06-20 ENCOUNTER — Ambulatory Visit (INDEPENDENT_AMBULATORY_CARE_PROVIDER_SITE_OTHER): Payer: BC Managed Care – PPO | Admitting: Podiatry

## 2023-06-20 ENCOUNTER — Encounter: Payer: Self-pay | Admitting: Podiatry

## 2023-06-20 DIAGNOSIS — M778 Other enthesopathies, not elsewhere classified: Secondary | ICD-10-CM | POA: Diagnosis not present

## 2023-06-20 DIAGNOSIS — M2041 Other hammer toe(s) (acquired), right foot: Secondary | ICD-10-CM

## 2023-06-20 DIAGNOSIS — M2021 Hallux rigidus, right foot: Secondary | ICD-10-CM

## 2023-06-23 NOTE — Progress Notes (Signed)
Subjective: Chief Complaint  Patient presents with   Routine Post Op    RM#12 POV #4 patient states doing well pain scale at a 0 no pain no meds.     57 year old female presents the office today for suture removal status post first MPJ arthrodesis, hammertoe repair right second toe with removal of foreign body to the third toe as well as flexor tenotomy's of the left third and fourth toes.  She presents today for care removal.  She has been doing well.  She still gets some swelling as she has been working.  Does not report any fevers or chills.    Objective: AAO x3, NAD DP/PT pulses palpable bilaterally, CRT less than 3 seconds Incision is well coapted and a scar is forming.  K wire intact to the second toe without any drainage.  Still some minimal edema along the surgical site there is no erythema or warmth noted.  Toes are rectus.  No significant pain on exam today. No other areas of discomfort.  No pain with calf compression, swelling, warmth, erythema  Assessment: Status post bilateral foot surgery  Plan: -All treatment options discussed with the patient including all alternatives, risks, complications.  -X-rays were obtained and reviewed.  Multiple views of the foot were obtained.  Hardware intact..  No evidence of acute fracture.  Increased consolidation noted across the arthrodesis site. -K wire removed today without any complications.  Small amount of antibiotic ointment was applied followed by dressing. -Continue cam boot for now.  Ice, elevation as well as compression over the residual edema. -Monitor for any clinical signs or symptoms of infection and directed to call the office immediately should any occur or go to the ER. -Patient encouraged to call the office with any questions, concerns, change in symptoms.   X-ray next appointment  Vivi Barrack DPM

## 2023-07-02 DIAGNOSIS — L680 Hirsutism: Secondary | ICD-10-CM | POA: Diagnosis not present

## 2023-07-08 ENCOUNTER — Encounter: Payer: Self-pay | Admitting: Podiatry

## 2023-07-08 ENCOUNTER — Ambulatory Visit (INDEPENDENT_AMBULATORY_CARE_PROVIDER_SITE_OTHER): Payer: BC Managed Care – PPO

## 2023-07-08 ENCOUNTER — Ambulatory Visit (INDEPENDENT_AMBULATORY_CARE_PROVIDER_SITE_OTHER): Payer: BC Managed Care – PPO | Admitting: Podiatry

## 2023-07-08 DIAGNOSIS — M2041 Other hammer toe(s) (acquired), right foot: Secondary | ICD-10-CM

## 2023-07-08 DIAGNOSIS — M778 Other enthesopathies, not elsewhere classified: Secondary | ICD-10-CM

## 2023-07-08 DIAGNOSIS — M2021 Hallux rigidus, right foot: Secondary | ICD-10-CM

## 2023-07-08 NOTE — Progress Notes (Signed)
Subjective: Chief Complaint  Patient presents with   Routine Post Op    RM#13 POV #5 DOS 05/08/23---RIGHT FOOT FUSION OF FIRST MPJ (BIG TOE), HAMMERTOE REPAIR 2TOE WITH SHORTENING OF SECOND METATARSAL, FUSION TENOTOMY LEFT THIRD, FOURTH TOE, REMOVAL FOREIGN BODY RIGHT THIRD TOE /Patient states is doing well     57 year old female presents the office today for follow-up evaluation after undergoing right foot surgery.  States that she is doing well.  She is wearing a surgical shoe.  She states that she did a lot of walking going shopping over the weekend and did swell but not having significant pain since increase activity.  No injuries that she reports.  No fevers or chills.  She does feel that she walks with the office of her foot try to take pressure off of the surgical site.  Objective: AAO x3, NAD DP/PT pulses palpable bilaterally, CRT less than 3 seconds Incision is well coapted and a scar has formed.  There is trace edema but there is no erythema or warmth.  There is no significant pain on exam.  Arthrodesis appears to be stable.  Toes are rectus.  No other areas of discomfort. No pain with calf compression, swelling, warmth, erythema  Assessment: Status post bilateral foot surgery  Plan: -All treatment options discussed with the patient including all alternatives, risks, complications.  -X-rays were obtained and reviewed.  Multiple views of the foot were obtained.  Hardware intact..  No evidence of acute fracture.  Increased consolidation noted across the arthrodesis site. No complicating factor.  -At this time we discussed starting physical therapy and [facility].  Continue surgical shoe for now discussed gradual transition to regular, supportive sneaker as tolerated.  Ice, elevation as well as compression of any residual edema.  Return for 3-4 weeks for post-op visit, x-ray.  Vivi Barrack DPM

## 2023-08-01 DIAGNOSIS — M25674 Stiffness of right foot, not elsewhere classified: Secondary | ICD-10-CM | POA: Diagnosis not present

## 2023-08-01 DIAGNOSIS — M799 Soft tissue disorder, unspecified: Secondary | ICD-10-CM | POA: Diagnosis not present

## 2023-08-01 DIAGNOSIS — M25571 Pain in right ankle and joints of right foot: Secondary | ICD-10-CM | POA: Diagnosis not present

## 2023-08-01 DIAGNOSIS — M6281 Muscle weakness (generalized): Secondary | ICD-10-CM | POA: Diagnosis not present

## 2023-08-05 ENCOUNTER — Encounter: Payer: BC Managed Care – PPO | Admitting: Podiatry

## 2023-08-09 DIAGNOSIS — M6281 Muscle weakness (generalized): Secondary | ICD-10-CM | POA: Diagnosis not present

## 2023-08-09 DIAGNOSIS — M25571 Pain in right ankle and joints of right foot: Secondary | ICD-10-CM | POA: Diagnosis not present

## 2023-08-09 DIAGNOSIS — M25674 Stiffness of right foot, not elsewhere classified: Secondary | ICD-10-CM | POA: Diagnosis not present

## 2023-08-09 DIAGNOSIS — M799 Soft tissue disorder, unspecified: Secondary | ICD-10-CM | POA: Diagnosis not present

## 2023-08-12 ENCOUNTER — Encounter: Payer: BC Managed Care – PPO | Admitting: Podiatry

## 2023-08-14 DIAGNOSIS — M25674 Stiffness of right foot, not elsewhere classified: Secondary | ICD-10-CM | POA: Diagnosis not present

## 2023-08-14 DIAGNOSIS — M25571 Pain in right ankle and joints of right foot: Secondary | ICD-10-CM | POA: Diagnosis not present

## 2023-08-14 DIAGNOSIS — M799 Soft tissue disorder, unspecified: Secondary | ICD-10-CM | POA: Diagnosis not present

## 2023-08-14 DIAGNOSIS — C44311 Basal cell carcinoma of skin of nose: Secondary | ICD-10-CM | POA: Diagnosis not present

## 2023-08-14 DIAGNOSIS — M6281 Muscle weakness (generalized): Secondary | ICD-10-CM | POA: Diagnosis not present

## 2023-08-14 DIAGNOSIS — D485 Neoplasm of uncertain behavior of skin: Secondary | ICD-10-CM | POA: Diagnosis not present

## 2023-08-16 DIAGNOSIS — M799 Soft tissue disorder, unspecified: Secondary | ICD-10-CM | POA: Diagnosis not present

## 2023-08-16 DIAGNOSIS — M6281 Muscle weakness (generalized): Secondary | ICD-10-CM | POA: Diagnosis not present

## 2023-08-16 DIAGNOSIS — M25674 Stiffness of right foot, not elsewhere classified: Secondary | ICD-10-CM | POA: Diagnosis not present

## 2023-08-16 DIAGNOSIS — M25571 Pain in right ankle and joints of right foot: Secondary | ICD-10-CM | POA: Diagnosis not present

## 2023-08-19 ENCOUNTER — Encounter: Payer: Self-pay | Admitting: Podiatry

## 2023-08-19 ENCOUNTER — Ambulatory Visit (INDEPENDENT_AMBULATORY_CARE_PROVIDER_SITE_OTHER): Payer: BC Managed Care – PPO | Admitting: Podiatry

## 2023-08-19 ENCOUNTER — Ambulatory Visit (INDEPENDENT_AMBULATORY_CARE_PROVIDER_SITE_OTHER): Payer: BC Managed Care – PPO

## 2023-08-19 DIAGNOSIS — M2021 Hallux rigidus, right foot: Secondary | ICD-10-CM | POA: Diagnosis not present

## 2023-08-21 DIAGNOSIS — M6281 Muscle weakness (generalized): Secondary | ICD-10-CM | POA: Diagnosis not present

## 2023-08-21 DIAGNOSIS — M25674 Stiffness of right foot, not elsewhere classified: Secondary | ICD-10-CM | POA: Diagnosis not present

## 2023-08-21 DIAGNOSIS — M799 Soft tissue disorder, unspecified: Secondary | ICD-10-CM | POA: Diagnosis not present

## 2023-08-21 DIAGNOSIS — M25571 Pain in right ankle and joints of right foot: Secondary | ICD-10-CM | POA: Diagnosis not present

## 2023-08-21 NOTE — Progress Notes (Signed)
Subjective: Chief Complaint  Patient presents with   Routine Post Op    RM#11 Right foot follow up patient states doing very well no concerns at this time.     58 year old female presents the office today for follow-up evaluation after undergoing right foot surgery.  States that she doing very well.  She is back to her regular shoes and she is trying to do more normal activities.  She does not report any concerns she is very happy with the outcome of the surgery.  She feels that therapy is still been helpful helping with her gait.  Objective: AAO x3, NAD DP/PT pulses palpable bilaterally, CRT less than 3 seconds Incision is well coapted and a scar has formed.  There is no pain on exam.  Nonweightbearing evaluation reveals the toe is rectus.  Upon weightbearing the toe does sit somewhat dorsiflexed.  There is no pain with this and there is no pain laterally submetatarsal area. No pain with calf compression, swelling, warmth, erythema  Assessment: Status post bilateral foot surgery  Plan: -All treatment options discussed with the patient including all alternatives, risks, complications.  -X-rays were obtained and reviewed.  Multiple views of the foot were obtained.  Hardware intact..  No evidence of acute fracture.  Increased consolidation noted across the arthrodesis site.  -We discussed position of the big toe.  Is not causing any issues for nail so we will involve any further treatment for this.  I discussed making a custom orthotic should she have any pain with this or any transfer lesions or pain.  Will continue the therapy and see how she does and if she continues to progress not be any issues we will continue with regular, supportive shoe gear.  Return in about 2 months (around 10/17/2023), or if symptoms worsen or fail to improve.  Vivi Barrack DPM

## 2023-08-23 DIAGNOSIS — M25571 Pain in right ankle and joints of right foot: Secondary | ICD-10-CM | POA: Diagnosis not present

## 2023-08-23 DIAGNOSIS — M25674 Stiffness of right foot, not elsewhere classified: Secondary | ICD-10-CM | POA: Diagnosis not present

## 2023-08-23 DIAGNOSIS — M6281 Muscle weakness (generalized): Secondary | ICD-10-CM | POA: Diagnosis not present

## 2023-08-23 DIAGNOSIS — M799 Soft tissue disorder, unspecified: Secondary | ICD-10-CM | POA: Diagnosis not present

## 2023-08-28 DIAGNOSIS — M25674 Stiffness of right foot, not elsewhere classified: Secondary | ICD-10-CM | POA: Diagnosis not present

## 2023-08-28 DIAGNOSIS — M6281 Muscle weakness (generalized): Secondary | ICD-10-CM | POA: Diagnosis not present

## 2023-08-28 DIAGNOSIS — M25571 Pain in right ankle and joints of right foot: Secondary | ICD-10-CM | POA: Diagnosis not present

## 2023-08-28 DIAGNOSIS — M799 Soft tissue disorder, unspecified: Secondary | ICD-10-CM | POA: Diagnosis not present

## 2023-08-30 DIAGNOSIS — M799 Soft tissue disorder, unspecified: Secondary | ICD-10-CM | POA: Diagnosis not present

## 2023-08-30 DIAGNOSIS — M6281 Muscle weakness (generalized): Secondary | ICD-10-CM | POA: Diagnosis not present

## 2023-08-30 DIAGNOSIS — M25674 Stiffness of right foot, not elsewhere classified: Secondary | ICD-10-CM | POA: Diagnosis not present

## 2023-08-30 DIAGNOSIS — M25571 Pain in right ankle and joints of right foot: Secondary | ICD-10-CM | POA: Diagnosis not present

## 2023-09-04 DIAGNOSIS — M25674 Stiffness of right foot, not elsewhere classified: Secondary | ICD-10-CM | POA: Diagnosis not present

## 2023-09-04 DIAGNOSIS — M799 Soft tissue disorder, unspecified: Secondary | ICD-10-CM | POA: Diagnosis not present

## 2023-09-04 DIAGNOSIS — M6281 Muscle weakness (generalized): Secondary | ICD-10-CM | POA: Diagnosis not present

## 2023-09-04 DIAGNOSIS — M25571 Pain in right ankle and joints of right foot: Secondary | ICD-10-CM | POA: Diagnosis not present

## 2023-09-06 DIAGNOSIS — M25674 Stiffness of right foot, not elsewhere classified: Secondary | ICD-10-CM | POA: Diagnosis not present

## 2023-09-06 DIAGNOSIS — M25571 Pain in right ankle and joints of right foot: Secondary | ICD-10-CM | POA: Diagnosis not present

## 2023-09-06 DIAGNOSIS — M6281 Muscle weakness (generalized): Secondary | ICD-10-CM | POA: Diagnosis not present

## 2023-09-06 DIAGNOSIS — M799 Soft tissue disorder, unspecified: Secondary | ICD-10-CM | POA: Diagnosis not present

## 2023-09-11 DIAGNOSIS — M799 Soft tissue disorder, unspecified: Secondary | ICD-10-CM | POA: Diagnosis not present

## 2023-09-11 DIAGNOSIS — M6281 Muscle weakness (generalized): Secondary | ICD-10-CM | POA: Diagnosis not present

## 2023-09-11 DIAGNOSIS — M25571 Pain in right ankle and joints of right foot: Secondary | ICD-10-CM | POA: Diagnosis not present

## 2023-09-11 DIAGNOSIS — M25674 Stiffness of right foot, not elsewhere classified: Secondary | ICD-10-CM | POA: Diagnosis not present

## 2023-09-24 DIAGNOSIS — C44311 Basal cell carcinoma of skin of nose: Secondary | ICD-10-CM | POA: Diagnosis not present

## 2023-11-26 IMAGING — DX DG CHEST 2V
2 series · 2 of 2 positions shown · non-contrast
Comparison: April 17, 2020.

CLINICAL DATA: Chest pain.

EXAM:
CHEST - 2 VIEW

[chest pa]
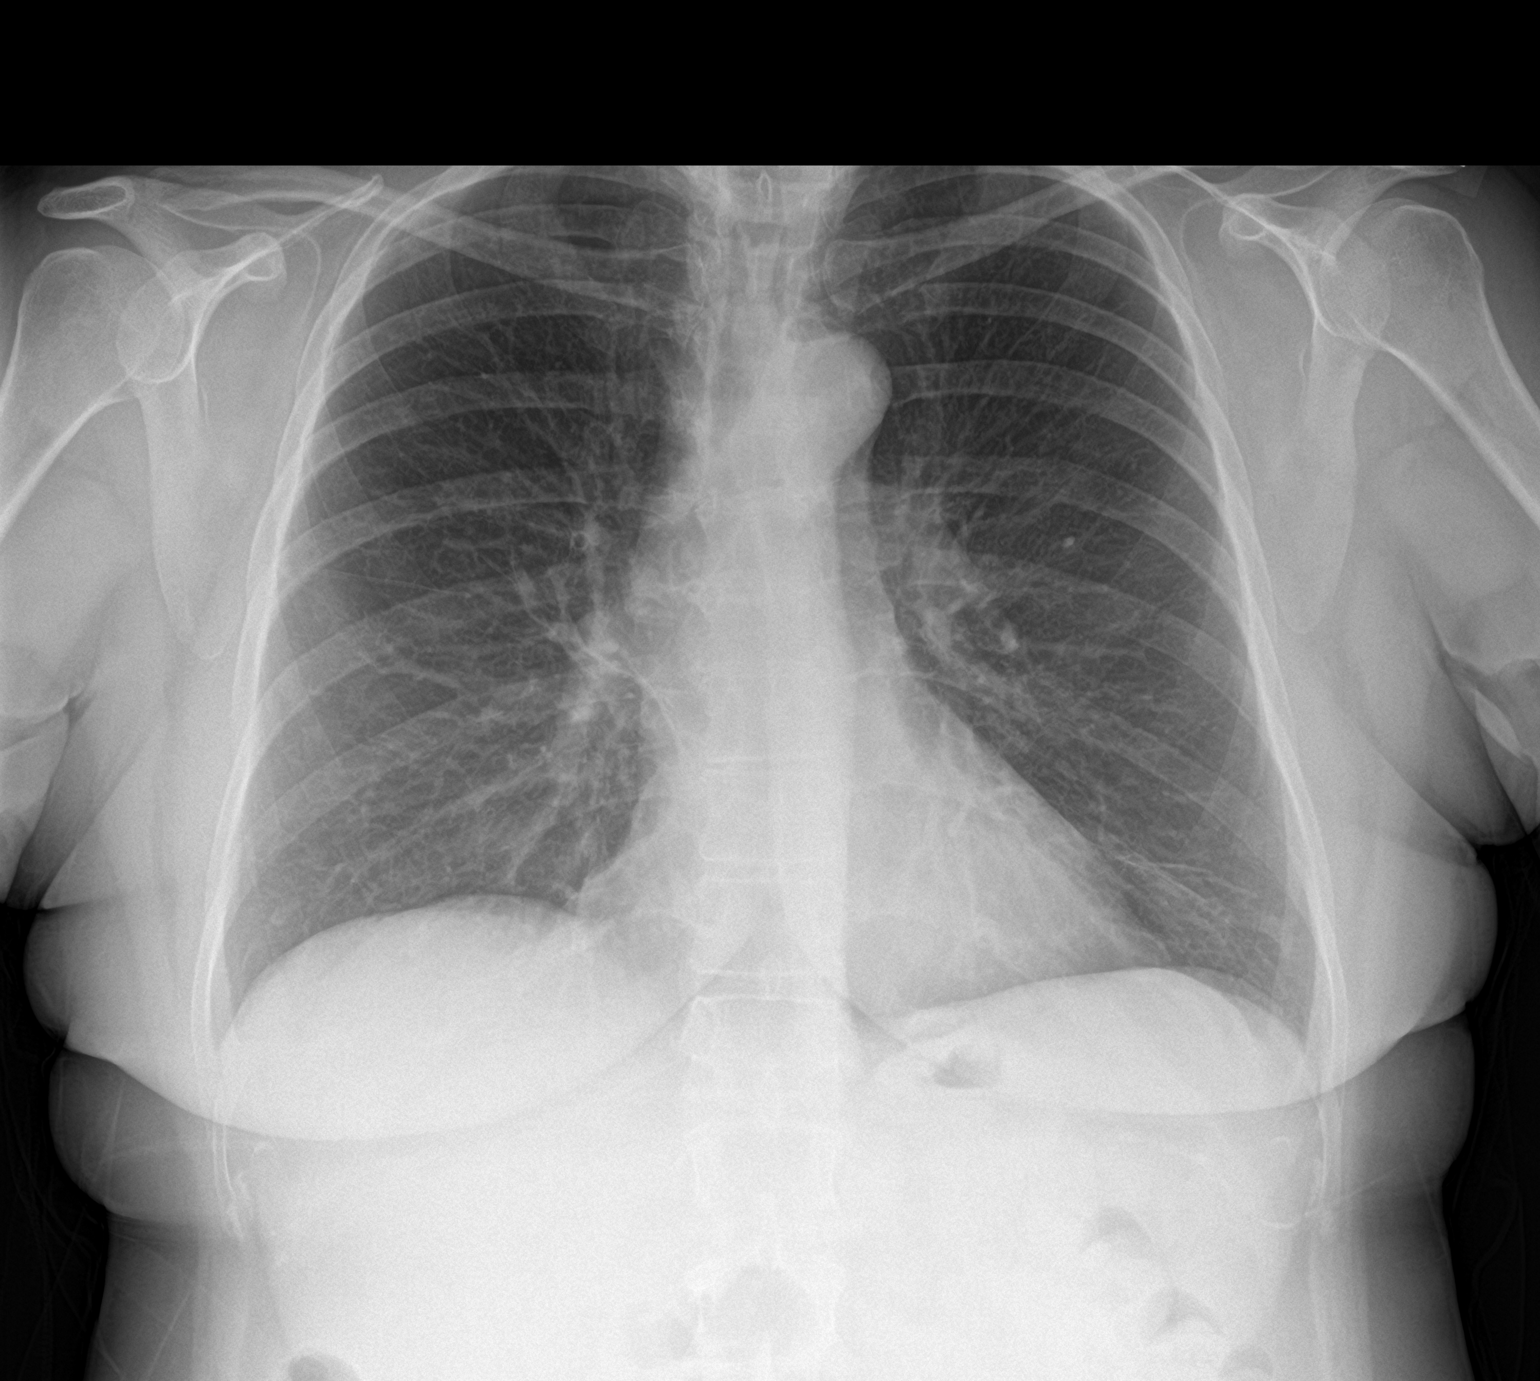

[chest lat]
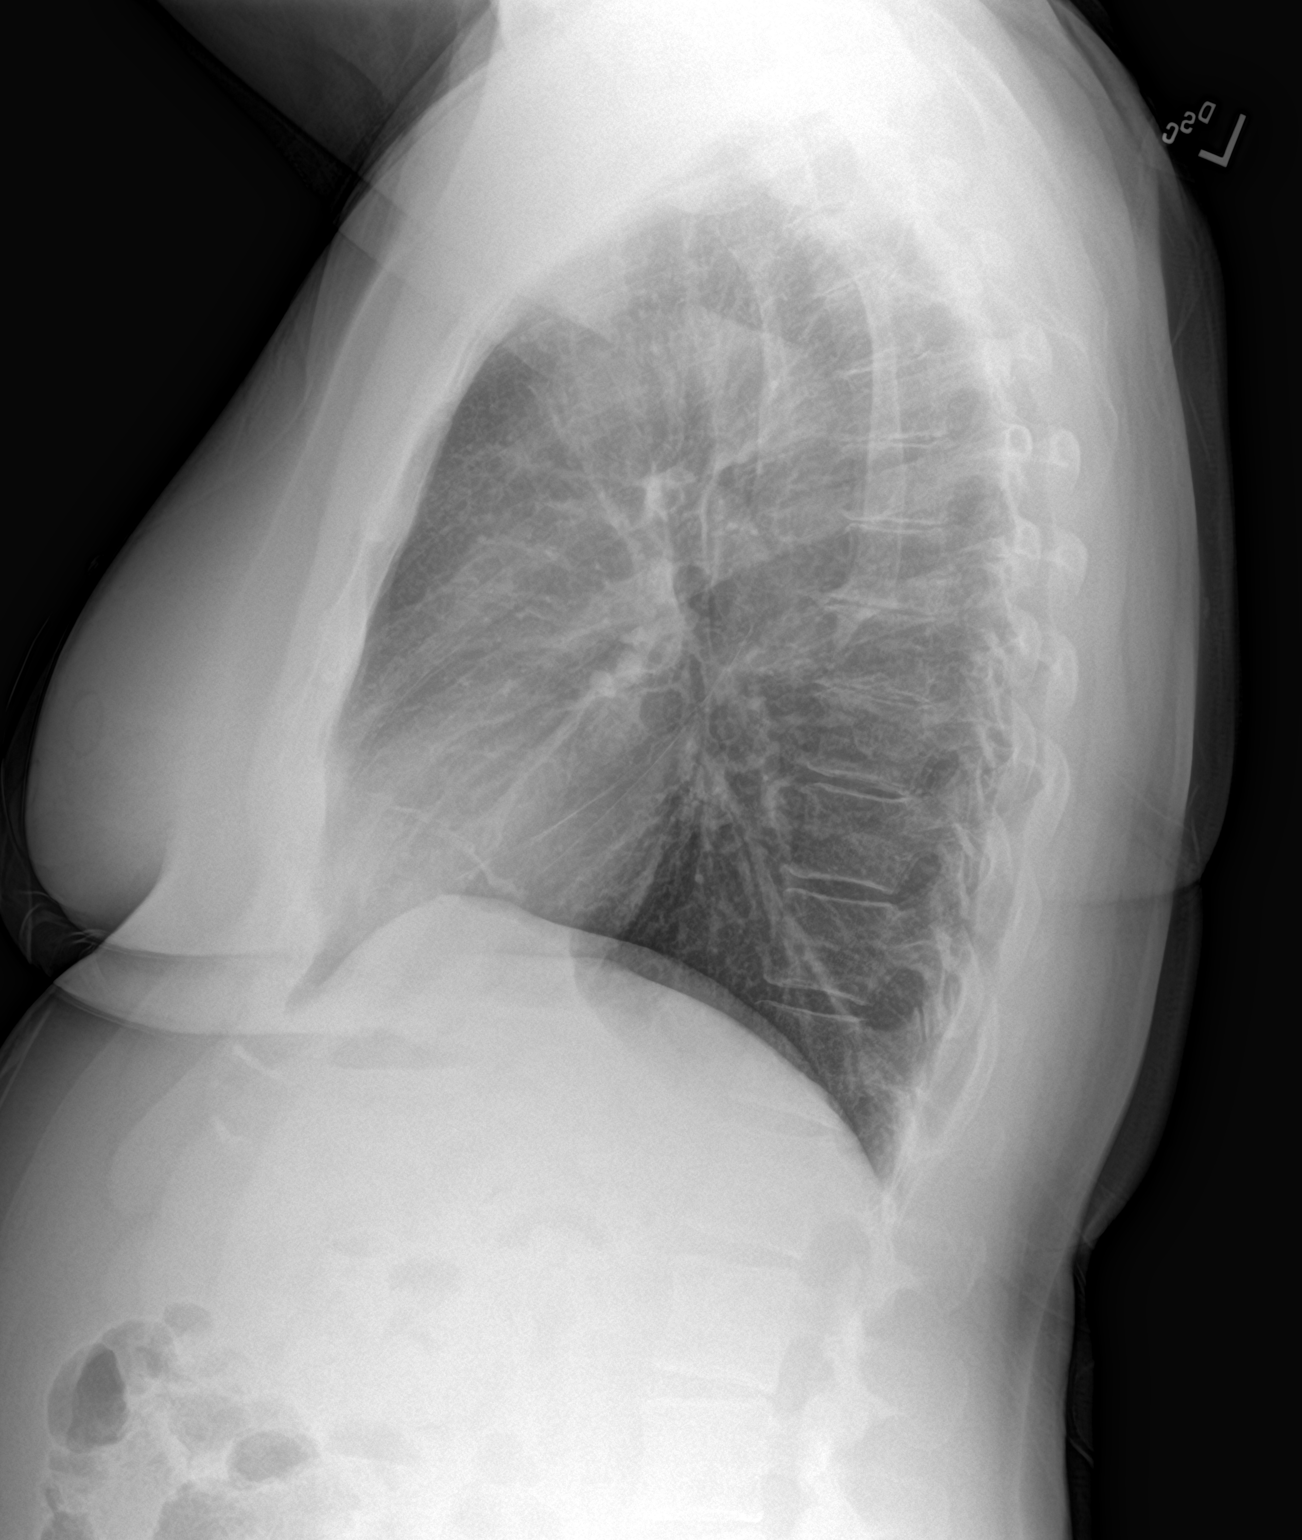

[2 of 2 positions shown; findings below may reference images not displayed]

FINDINGS: The heart size and mediastinal contours are within normal limits.
Both lungs are clear. The visualized skeletal structures are
unremarkable.
IMPRESSION: No active cardiopulmonary disease.

## 2024-02-14 DIAGNOSIS — L578 Other skin changes due to chronic exposure to nonionizing radiation: Secondary | ICD-10-CM | POA: Diagnosis not present

## 2024-02-14 DIAGNOSIS — L2089 Other atopic dermatitis: Secondary | ICD-10-CM | POA: Diagnosis not present

## 2024-02-14 DIAGNOSIS — D1801 Hemangioma of skin and subcutaneous tissue: Secondary | ICD-10-CM | POA: Diagnosis not present

## 2024-02-14 DIAGNOSIS — L821 Other seborrheic keratosis: Secondary | ICD-10-CM | POA: Diagnosis not present

## 2024-02-24 ENCOUNTER — Telehealth: Admitting: Family Medicine

## 2024-02-24 ENCOUNTER — Encounter: Payer: Self-pay | Admitting: Physician Assistant

## 2024-02-24 DIAGNOSIS — U071 COVID-19: Secondary | ICD-10-CM | POA: Diagnosis not present

## 2024-02-24 MED ORDER — NIRMATRELVIR/RITONAVIR (PAXLOVID)TABLET: 3.0000 | ORAL_TABLET | Freq: Two times a day (BID) | ORAL | 0 refills | Status: AC

## 2024-02-24 NOTE — Patient Instructions (Signed)
 COVID-19: What to Know COVID-19 is an infection caused by a virus called SARS-CoV-2. This type of virus is called a coronavirus. People with COVID-19 may: Have few to no symptoms. Have mild to moderate symptoms that affect their lungs and breathing. Get very sick. What are the causes?  COVID-19 is caused by a virus. This virus may be in the air as droplets or on surfaces. It can spread from an infected person when they cough, sneeze, speak, sing, or breathe. You may become infected if: You breathe in the infected droplets in the air. You touch an object that has the virus on it. What increases the risk? You are at risk of getting COVID-19 if you have been around someone with the infection. You may be more likely to get very sick if: You are 5 years old or older. You have certain medical conditions, such as: Heart disease. Diabetes. Long-term respiratory disease. Cancer. Pregnancy. You are immunocompromised. This means your body can't fight infections easily. You have a disability that makes it hard for you to move around, you have trouble moving, or you can't move at all. What are the signs or symptoms? People may have different symptoms from COVID-19. The symptoms can also be mild to very bad. They often show up in 5-6 days after being infected. But, they can take up to 14 days to appear. Common symptoms are: Cough. Feeling tired. New loss of taste or smell. Fever. Less common symptoms are: Sore throat. Headache. Body or muscle aches. Diarrhea. A skin rash or fingers or toes that are a different color than usual. Red or irritated eyes. Sometimes, COVID-19 does not cause symptoms. How is this diagnosed? COVID-19 can be diagnosed with tests done in the lab or at home. Fluid from your nose, mouth, or lungs will be used to check for the virus. How is this treated? Treatment for COVID-19 depends on how sick you are. Mild symptoms can be treated at home with rest, fluids, and  over-the-counter medicines. very bad symptoms may be treated in a hospital intensive care unit (ICU). If you have symptoms and are at risk of getting very sick, you may be given a medicine that fights viruses. This medicine is called an antiviral. How is this prevented? To protect yourself from COVID-19: Know your risk factors. Get vaccinated. If your body can't fight infections easily, talk to your provider about treatment to help prevent COVID-19. Stay at least about 3 feet (1 meter) away from other people. Wear mask that fits well when: You can't stay at a distance from people. You're in a place with not a lot of air flow. Try to be in open spaces with good air flow when you are in public. Wash your hands often or use an alcohol-based hand sanitizer. Cover your nose and mouth when you cough or sneeze. If you think you have COVID-19 or have been around someone who has it, stay home and away from other people as told by your provider or health officials. Where to find more information To learn more: Go to [employer] Click Health Topics. Type COVID-19 in the search box. Go to Visit[employer]ination.com.br Click Health Topics. Then click All Topics. Type COVID-19 in the search box. Get help right away if: You have trouble breathing or get short of breath. You have pain or pressure in your chest. You're feeling confused. These symptoms may be an emergency. Get help right away. Call 911. Do not wait to see if the symptoms will go away.  Do not drive yourself to the hospital. This information is not intended to replace advice given to you by your health care provider. Make sure you discuss any questions you have with your health care provider. Document Revised: 04/18/2023 Document Reviewed: 04/10/2023 Elsevier Patient Education  2025 [employer].

## 2024-02-24 NOTE — Telephone Encounter (Signed)
 Yes that is fine.  Worth HERO. Kennyth, MD 02/24/2024 3:15 PM

## 2024-02-24 NOTE — Progress Notes (Signed)
 Virtual Visit Consent   Rosangela Fehrenbach Mask, you are scheduled for a virtual visit with a [facility] provider today. Just as with appointments in the office, your consent must be obtained to participate. Your consent will be active for this visit and any virtual visit you may have with one of our providers in the next 365 days. If you have a MyChart account, a copy of this consent can be sent to you electronically.  As this is a virtual visit, video technology does not allow for your provider to perform a traditional examination. This may limit your provider's ability to fully assess your condition. If your provider identifies any concerns that need to be evaluated in person or the need to arrange testing (such as labs, EKG, etc.), we will make arrangements to do so. Although advances in technology are sophisticated, we cannot ensure that it will always work on either your end or our end. If the connection with a video visit is poor, the visit may have to be switched to a telephone visit. With either a video or telephone visit, we are not always able to ensure that we have a secure connection.  By engaging in this virtual visit, you consent to the provision of healthcare and authorize for your insurance to be billed (if applicable) for the services provided during this visit. Depending on your insurance coverage, you may receive a charge related to this service.  I need to obtain your verbal consent now. Are you willing to proceed with your visit today? Hephzibah Strehle Koppenhaver has provided verbal consent on 02/24/2024 for a virtual visit (video or telephone). Loa Lamp, FNP  Date: 02/24/2024 8:42 AM   Virtual Visit via Video Note   I, Loa Lamp, connected with  Notnamed Croucher Blitch  ([id number], 1966-02-10) on 02/24/24 at  8:30 AM EDT by a video-enabled telemedicine application and verified that I am speaking with the correct person using two identifiers.  Location: Patient: Virtual Visit Location Patient:  Home Provider: Virtual Visit Location Provider: Home Office   I discussed the limitations of evaluation and management by telemedicine and the availability of in person appointments. The patient expressed understanding and agreed to proceed.    History of Present Illness: Dana Savage is a 58 y.o. who identifies as a female who was assigned female at birth, and is being seen today for positive in home covid testing with mother sick with covid and she is living in home with mother to care for her. Headache, fever, sinus pressure and congestion. Sx persistent for 2 days. SABRA  HPI: HPI  Problems:  Patient Active Problem List   Diagnosis Date Noted   Benign familial tremor 02/09/2022   Insomnia 02/09/2022   Carpal tunnel syndrome of left wrist 02/21/2018   Family history of myocardial infarction 05/07/2017   Plantar fasciitis 05/07/2017   Overweight (BMI 25.0-29.9) 05/07/2017   Allergic rhinitis 07/08/2012   Mild asthma 07/08/2012    Allergies:  Allergies  Allergen Reactions   Aspirin Hives    Pt tolerates ibuprofen  Other reaction(s): Unknown   Codeine     Extreme vomiting Other reaction(s): Unknown   Flavoring Agent (Non-Screening)     Chocolate Other Reaction(s): lungs fill with fluid   Latex Itching   Shellfish-Derived Products     Other Reaction(s): profuse vomiting   Medications:  Current Outpatient Medications:    nirmatrelvir /ritonavir  (PAXLOVID ) 20 x 150 MG & 10 x 100MG  TABS, Take 3 tablets by mouth 2 (two) times daily  for 5 days. (Take nirmatrelvir  150 mg two tablets twice daily for 5 days and ritonavir  100 mg one tablet twice daily for 5 days) Patient GFR is normal, Disp: 30 tablet, Rfl: 0   albuterol  (VENTOLIN  HFA) 108 (90 Base) MCG/ACT inhaler, Inhale 2 puffs into the lungs every 6 (six) hours as needed for wheezing or shortness of breath., Disp: 6.7 each, Rfl: 2   Budesonide  (PULMICORT  FLEXHALER) 90 MCG/ACT inhaler, Inhale 1 puff into the lungs 2 (two) times daily.  (Patient taking differently: Inhale 1 puff into the lungs as needed.), Disp: 1 each, Rfl: 2   cephALEXin  (KEFLEX ) 500 MG capsule, Take 1 capsule (500 mg total) by mouth 3 (three) times daily., Disp: 21 capsule, Rfl: 0   fluticasone  (FLONASE ) 50 MCG/ACT nasal spray, Place 1 spray into both nostrils at bedtime., Disp: , Rfl:    gabapentin  (NEURONTIN ) 100 MG capsule, Take 1 capsule (100 mg total) by mouth 3 (three) times daily., Disp: 30 capsule, Rfl: 0   ibuprofen  (ADVIL ) 200 MG tablet, Take 400 mg by mouth every 6 (six) hours as needed., Disp: , Rfl:    ibuprofen  (ADVIL ) 800 MG tablet, Take 1 tablet (800 mg total) by mouth every 8 (eight) hours as needed., Disp: 30 tablet, Rfl: 0   meloxicam  (MOBIC ) 15 MG tablet, Take 1 tablet (15 mg total) by mouth daily., Disp: 30 tablet, Rfl: 1   Multiple Vitamin (MULTIVITAMIN) tablet, Take 1 tablet by mouth daily., Disp: , Rfl:   Observations/Objective: Patient is well-developed, well-nourished in no acute distress.  Resting comfortably  at home.  Head is normocephalic, atraumatic.  No labored breathing.  Speech is clear and coherent with logical content.  Patient is alert and oriented at baseline.    Assessment and Plan: 1. COVID (Primary)  Increase fluids, humidifier at night, tylenol or ibuprofen  as directed, UC if sx worsen. MVI With Vit d and zinc and quarantine discussed.   Follow Up Instructions: I discussed the assessment and treatment plan with the patient. The patient was provided an opportunity to ask questions and all were answered. The patient agreed with the plan and demonstrated an understanding of the instructions.  A copy of instructions were sent to the patient via MyChart unless otherwise noted below.     The patient was advised to call back or seek an in-person evaluation if the symptoms worsen or if the condition fails to improve as anticipated.    Zedric Deroy, FNP

## 2024-02-24 NOTE — Telephone Encounter (Signed)
Dr. Parker please see message and advise. 

## 2024-04-16 DIAGNOSIS — H53143 Visual discomfort, bilateral: Secondary | ICD-10-CM | POA: Diagnosis not present
